# Patient Record
Sex: Female | Born: 1980
Health system: Southern US, Community
[De-identification: ages and names within clinical notes are randomized; demographics above are authoritative.]

## PROBLEM LIST (undated history)

## (undated) DIAGNOSIS — T783XXA Angioneurotic edema, initial encounter: Secondary | ICD-10-CM

## (undated) DIAGNOSIS — L509 Urticaria, unspecified: Secondary | ICD-10-CM

## (undated) DIAGNOSIS — C539 Malignant neoplasm of cervix uteri, unspecified: Secondary | ICD-10-CM

## (undated) HISTORY — PX: LEEP: SHX91

## (undated) HISTORY — PX: OTHER SURGICAL HISTORY: SHX169

## (undated) HISTORY — DX: Urticaria, unspecified: L50.9

## (undated) HISTORY — DX: Angioneurotic edema, initial encounter: T78.3XXA

---

## 2005-02-17 ENCOUNTER — Inpatient Hospital Stay (HOSPITAL_COMMUNITY): Admission: AD | Admit: 2005-02-17 | Discharge: 2005-02-17 | Payer: Self-pay | Admitting: Obstetrics & Gynecology

## 2016-03-28 ENCOUNTER — Emergency Department (HOSPITAL_BASED_OUTPATIENT_CLINIC_OR_DEPARTMENT_OTHER)
Admission: EM | Admit: 2016-03-28 | Discharge: 2016-03-28 | Disposition: A | Discharge status: Home / Self Care | Payer: Medicaid Other | Attending: Physician Assistant | Admitting: Physician Assistant

## 2016-03-28 ENCOUNTER — Encounter (HOSPITAL_BASED_OUTPATIENT_CLINIC_OR_DEPARTMENT_OTHER): Payer: Self-pay

## 2016-03-28 DIAGNOSIS — Z8541 Personal history of malignant neoplasm of cervix uteri: Secondary | ICD-10-CM | POA: Insufficient documentation

## 2016-03-28 DIAGNOSIS — M545 Low back pain: Secondary | ICD-10-CM | POA: Diagnosis present

## 2016-03-28 DIAGNOSIS — M549 Dorsalgia, unspecified: Secondary | ICD-10-CM

## 2016-03-28 HISTORY — DX: Malignant neoplasm of cervix uteri, unspecified: C53.9

## 2016-03-28 LAB — URINALYSIS, ROUTINE W REFLEX MICROSCOPIC
BILIRUBIN URINE: NEGATIVE
Glucose, UA: NEGATIVE mg/dL
KETONES UR: NEGATIVE mg/dL
Leukocytes, UA: NEGATIVE
NITRITE: NEGATIVE
PH: 6.5 (ref 5.0–8.0)
PROTEIN: NEGATIVE mg/dL
Specific Gravity, Urine: 1.02 (ref 1.005–1.030)

## 2016-03-28 LAB — URINE MICROSCOPIC-ADD ON

## 2016-03-28 LAB — PREGNANCY, URINE: Preg Test, Ur: NEGATIVE

## 2016-03-28 MED ORDER — OXYCODONE-ACETAMINOPHEN 5-325 MG PO TABS: 1.0000 | ORAL_TABLET | Freq: Four times a day (QID) | ORAL | Status: DC | PRN

## 2016-03-28 MED ORDER — CYCLOBENZAPRINE HCL 10 MG PO TABS: 10.0000 mg | ORAL_TABLET | Freq: Two times a day (BID) | ORAL | Status: DC | PRN

## 2016-03-28 MED ORDER — IBUPROFEN 800 MG PO TABS: 800.0000 mg | ORAL_TABLET | Freq: Three times a day (TID) | ORAL | Status: DC

## 2016-03-28 MED ORDER — IBUPROFEN 800 MG PO TABS
800.0000 mg | ORAL_TABLET | Freq: Once | ORAL | Status: AC
  Administered 2016-03-28: 800 mg via ORAL
  Filled 2016-03-28: qty 1

## 2016-03-28 MED FILL — IBUPROFEN 800 MG TABLET: 800 | 7 days supply | Qty: 21 | Fill #0

## 2016-03-28 MED FILL — CYCLOBENZAPRINE 10 MG TAB: 10 | 10 days supply | Qty: 10 | Fill #0

## 2016-03-28 MED FILL — OXYCODONE/APAP 5-325: 5-325 | 2 days supply | Qty: 5 | Fill #0

## 2016-03-28 NOTE — Discharge Instructions (Signed)
Back Exercises °The following exercises strengthen the muscles that help to support the back. They also help to keep the lower back flexible. Doing these exercises can help to prevent back pain or lessen existing pain. °If you have back pain or discomfort, try doing these exercises 2-3 times each day or as told by your health care provider. When the pain goes away, do them once each day, but increase the number of times that you repeat the steps for each exercise (do more repetitions). If you do not have back pain or discomfort, do these exercises once each day or as told by your health care provider. °EXERCISES °Single Knee to Chest °Repeat these steps 3-5 times for each leg: °1. Lie on your back on a firm bed or the floor with your legs extended. °2. Bring one knee to your chest. Your other leg should stay extended and in contact with the floor. °3. Hold your knee in place by grabbing your knee or thigh. °4. Pull on your knee until you feel a gentle stretch in your lower back. °5. Hold the stretch for 10-30 seconds. °6. Slowly release and straighten your leg. °Pelvic Tilt °Repeat these steps 5-10 times: °1. Lie on your back on a firm bed or the floor with your legs extended. °2. Bend your knees so they are pointing toward the ceiling and your feet are flat on the floor. °3. Tighten your lower abdominal muscles to press your lower back against the floor. This motion will tilt your pelvis so your tailbone points up toward the ceiling instead of pointing to your feet or the floor. °4. With gentle tension and even breathing, hold this position for 5-10 seconds. °Cat-Cow °Repeat these steps until your lower back becomes more flexible: °1. Get into a hands-and-knees position on a firm surface. Keep your hands under your shoulders, and keep your knees under your hips. You may place padding under your knees for comfort. °2. Let your head hang down, and point your tailbone toward the floor so your lower back becomes  rounded like the back of a cat. °3. Hold this position for 5 seconds. °4. Slowly lift your head and point your tailbone up toward the ceiling so your back forms a sagging arch like the back of a cow. °5. Hold this position for 5 seconds. °Press-Ups °Repeat these steps 5-10 times: °1. Lie on your abdomen (face-down) on the floor. °2. Place your palms near your head, about shoulder-width apart. °3. While you keep your back as relaxed as possible and keep your hips on the floor, slowly straighten your arms to raise the top half of your body and lift your shoulders. Do not use your back muscles to raise your upper torso. You may adjust the placement of your hands to make yourself more comfortable. °4. Hold this position for 5 seconds while you keep your back relaxed. °5. Slowly return to lying flat on the floor. °Bridges °Repeat these steps 10 times: °1. Lie on your back on a firm surface. °2. Bend your knees so they are pointing toward the ceiling and your feet are flat on the floor. °3. Tighten your buttocks muscles and lift your buttocks off of the floor until your waist is at almost the same height as your knees. You should feel the muscles working in your buttocks and the back of your thighs. If you do not feel these muscles, slide your feet 1-2 inches farther away from your buttocks. °4. Hold this position for 3-5   seconds. 5. Slowly lower your hips to the starting position, and allow your buttocks muscles to relax completely. If this exercise is too easy, try doing it with your arms crossed over your chest. Abdominal Crunches Repeat these steps 5-10 times: 1. Lie on your back on a firm bed or the floor with your legs extended. 2. Bend your knees so they are pointing toward the ceiling and your feet are flat on the floor. 3. Cross your arms over your chest. 4. Tip your chin slightly toward your chest without bending your neck. 5. Tighten your abdominal muscles and slowly raise your trunk (torso) high  enough to lift your shoulder blades a tiny bit off of the floor. Avoid raising your torso higher than that, because it can put too much stress on your low back and it does not help to strengthen your abdominal muscles. 6. Slowly return to your starting position. Back Lifts Repeat these steps 5-10 times: 1. Lie on your abdomen (face-down) with your arms at your sides, and rest your forehead on the floor. 2. Tighten the muscles in your legs and your buttocks. 3. Slowly lift your chest off of the floor while you keep your hips pressed to the floor. Keep the back of your head in line with the curve in your back. Your eyes should be looking at the floor. 4. Hold this position for 3-5 seconds. 5. Slowly return to your starting position. SEEK MEDICAL CARE IF:  Your back pain or discomfort gets much worse when you do an exercise.  Your back pain or discomfort does not lessen within 2 hours after you exercise. If you have any of these problems, stop doing these exercises right away. Do not do them again unless your health care provider says that you can. SEEK IMMEDIATE MEDICAL CARE IF:  You develop sudden, severe back pain. If this happens, stop doing the exercises right away. Do not do them again unless your health care provider says that you can.   This information is not intended to replace advice given to you by your health care provider. Make sure you discuss any questions you have with your health care provider.   Document Released: 10/10/2004 Document Revised: 05/24/2015 Document Reviewed: 10/27/2014 Elsevier Interactive Patient Education 2016 [place].  Back Exercises If you have pain in your back, do these exercises 2-3 times each day or as told by your doctor. When the pain goes away, do the exercises once each day, but repeat the steps more times for each exercise (do more repetitions). If you do not have pain in your back, do these exercises once each day or as told by your  doctor. EXERCISES Single Knee to Chest Do these steps 3-5 times in a row for each leg: 7. Lie on your back on a firm bed or the floor with your legs stretched out. 8. Bring one knee to your chest. 9. Hold your knee to your chest by grabbing your knee or thigh. 10. Pull on your knee until you feel a gentle stretch in your lower back. 11. Keep doing the stretch for 10-30 seconds. 12. Slowly let go of your leg and straighten it. Pelvic Tilt Do these steps 5-10 times in a row: 5. Lie on your back on a firm bed or the floor with your legs stretched out. 6. Bend your knees so they point up to the ceiling. Your feet should be flat on the floor. 7. Tighten your lower belly (abdomen) muscles to press your  lower back against the floor. This will make your tailbone point up to the ceiling instead of pointing down to your feet or the floor. 8. Stay in this position for 5-10 seconds while you gently tighten your muscles and breathe evenly. Cat-Cow Do these steps until your lower back bends more easily: 6. Get on your hands and knees on a firm surface. Keep your hands under your shoulders, and keep your knees under your hips. You may put padding under your knees. 7. Let your head hang down, and make your tailbone point down to the floor so your lower back is round like the back of a cat. 8. Stay in this position for 5 seconds. 9. Slowly lift your head and make your tailbone point up to the ceiling so your back hangs low (sags) like the back of a cow. 10. Stay in this position for 5 seconds. Press-Ups Do these steps 5-10 times in a row: 6. Lie on your belly (face-down) on the floor. 7. Place your hands near your head, about shoulder-width apart. 8. While you keep your back relaxed and keep your hips on the floor, slowly straighten your arms to raise the top half of your body and lift your shoulders. Do not use your back muscles. To make yourself more comfortable, you may change where you place your  hands. 9. Stay in this position for 5 seconds. 10. Slowly return to lying flat on the floor. Bridges Do these steps 10 times in a row: 6. Lie on your back on a firm surface. 7. Bend your knees so they point up to the ceiling. Your feet should be flat on the floor. 8. Tighten your butt muscles and lift your butt off of the floor until your waist is almost as high as your knees. If you do not feel the muscles working in your butt and the back of your thighs, slide your feet 1-2 inches farther away from your butt. 9. Stay in this position for 3-5 seconds. 10. Slowly lower your butt to the floor, and let your butt muscles relax. If this exercise is too easy, try doing it with your arms crossed over your chest. Belly Crunches Do these steps 5-10 times in a row: 7. Lie on your back on a firm bed or the floor with your legs stretched out. 8. Bend your knees so they point up to the ceiling. Your feet should be flat on the floor. 9. Cross your arms over your chest. 10. Tip your chin a little bit toward your chest but do not bend your neck. 75. Tighten your belly muscles and slowly raise your chest just enough to lift your shoulder blades a tiny bit off of the floor. 12. Slowly lower your chest and your head to the floor. Back Lifts Do these steps 5-10 times in a row: 6. Lie on your belly (face-down) with your arms at your sides, and rest your forehead on the floor. 7. Tighten the muscles in your legs and your butt. 8. Slowly lift your chest off of the floor while you keep your hips on the floor. Keep the back of your head in line with the curve in your back. Look at the floor while you do this. 9. Stay in this position for 3-5 seconds. 10. Slowly lower your chest and your face to the floor. GET HELP IF:  Your back pain gets a lot worse when you do an exercise.  Your back pain does not lessen 2 hours after  you exercise. If you have any of these problems, stop doing the exercises. Do not do them  again unless your doctor says it is okay. GET HELP RIGHT AWAY IF:  You have sudden, very bad back pain. If this happens, stop doing the exercises. Do not do them again unless your doctor says it is okay.   This information is not intended to replace advice given to you by your health care provider. Make sure you discuss any questions you have with your health care provider.   Document Released: 10/05/2010 Document Revised: 05/24/2015 Document Reviewed: 10/27/2014 Elsevier Interactive Patient Education [employer].

## 2016-03-28 NOTE — ED Provider Notes (Signed)
CSN: [id number]     Arrival date & time 03/28/16  [id number] History   First MD Initiated Contact with Patient 03/28/16 [phone]     Chief Complaint  Patient presents with  . Back Pain     (Consider location/radiation/quality/duration/timing/severity/associated sxs/prior Treatment) HPI  Patient is a 35 year old female with no significant past medical history presenting with lower back pain. She reports worse with moving right left, leaning forward or backwards. She has had no trauma. She has no fever, weakness, numbness, tingling. She reports no urinary symptoms. No pain with urination.  Past Medical History  Diagnosis Date  . Cervical cancer [facility])    Past Surgical History  Procedure Laterality Date  . Breast cyst removal     No family history on file. Social History  Substance Use Topics  . Smoking status: Never Smoker   . Smokeless tobacco: None  . Alcohol Use: No   OB History    No data available     Review of Systems  Constitutional: Negative for fever, activity change and fatigue.  Respiratory: Negative for shortness of breath.   Cardiovascular: Negative for chest pain.  Gastrointestinal: Negative for abdominal pain.  Genitourinary: Negative for urgency and difficulty urinating.  Musculoskeletal: Positive for back pain.  Psychiatric/Behavioral: Negative for confusion and agitation.      Allergies  Chocolate and Sulfa antibiotics  Home Medications   Prior to Admission medications   Not on File   BP 112/78 mmHg  Pulse 75  Temp(Src) 98.6 F (37 C) (Oral)  Resp 16  Ht 5\' 4"  (1.626 m)  Wt 200 lb (90.719 kg)  BMI 34.31 kg/m2  SpO2 100%  LMP 03/06/2016 Physical Exam  Constitutional: She is oriented to person, place, and time. She appears well-developed and well-nourished.  HENT:  Head: Normocephalic and atraumatic.  Eyes: Right eye exhibits no discharge.  Cardiovascular: Normal rate.   No murmur heard. Pulmonary/Chest: Effort normal and breath sounds normal.  No respiratory distress.  Musculoskeletal:  Tenderness all the way across her back. Worse with movement.  Neurological: She is oriented to person, place, and time.  Skin: Skin is warm and dry. She is not diaphoretic.  Psychiatric: She has a normal mood and affect.  Nursing note and vitals reviewed.   ED Course  Procedures (including critical care time) Labs Review Labs Reviewed  URINALYSIS, ROUTINE W REFLEX MICROSCOPIC (NOT AT [facility])  PREGNANCY, URINE    Imaging Review No results found. I have personally reviewed and evaluated these images and lab results as part of my medical decision-making.   EKG Interpretation None      MDM   Final diagnoses:  None   She is a pleasant 35 year old female presented back pain. It is worse with moving. It sounds muscular skeletal in nature. We'll get urine and pregnancy to make sure that it is not kidney infection or other etiology.  Will treat with ibuprofen and flexural. We'll give a couple stronger pain pills for evening pain.  Mykai Wendorf Julio Alm, MD 03/28/16 [phone]

## 2016-03-28 NOTE — ED Notes (Signed)
Pt reports lower lumbar pain, worse with movement from side to side.  Denies any injury or heavy lifting.  Reports worse when she lays down.

## 2016-03-28 NOTE — ED Notes (Signed)
MD at bedside. 

## 2017-08-09 ENCOUNTER — Emergency Department (HOSPITAL_BASED_OUTPATIENT_CLINIC_OR_DEPARTMENT_OTHER)
Admission: EM | Admit: 2017-08-09 | Discharge: 2017-08-10 | Disposition: A | Discharge status: Home / Self Care | Payer: Medicaid Other | Attending: Emergency Medicine | Admitting: Emergency Medicine

## 2017-08-09 ENCOUNTER — Encounter (HOSPITAL_BASED_OUTPATIENT_CLINIC_OR_DEPARTMENT_OTHER): Payer: Self-pay | Admitting: *Deleted

## 2017-08-09 ENCOUNTER — Other Ambulatory Visit: Payer: Self-pay

## 2017-08-09 DIAGNOSIS — Z79899 Other long term (current) drug therapy: Secondary | ICD-10-CM | POA: Diagnosis not present

## 2017-08-09 DIAGNOSIS — J029 Acute pharyngitis, unspecified: Secondary | ICD-10-CM | POA: Diagnosis present

## 2017-08-09 DIAGNOSIS — Z8541 Personal history of malignant neoplasm of cervix uteri: Secondary | ICD-10-CM | POA: Diagnosis not present

## 2017-08-09 LAB — RAPID STREP SCREEN (MED CTR MEBANE ONLY): STREPTOCOCCUS, GROUP A SCREEN (DIRECT): NEGATIVE

## 2017-08-09 MED ORDER — DEXAMETHASONE SODIUM PHOSPHATE 10 MG/ML IJ SOLN
10.0000 mg | Freq: Once | INTRAMUSCULAR | Status: AC
Start: 2017-08-09 — End: 2017-08-09
  Administered 2017-08-09: 10 mg
  Filled 2017-08-09: qty 1

## 2017-08-09 MED ORDER — LIDOCAINE VISCOUS 2 % MT SOLN: 20.0000 mL | OROMUCOSAL | 0 refills | Status: DC | PRN

## 2017-08-09 MED ORDER — IBUPROFEN 400 MG PO TABS
600.0000 mg | ORAL_TABLET | Freq: Once | ORAL | Status: AC
  Administered 2017-08-09: 600 mg via ORAL
  Filled 2017-08-09: qty 1

## 2017-08-09 NOTE — ED Triage Notes (Signed)
Pt reports sore throat since yesterday.  

## 2017-08-09 NOTE — Discharge Instructions (Signed)
Your strep test was negative.  This is likely a viral process.  Do not feel antibiotics are indicated at this time.  Would encourage you to take Motrin and Tylenol.  We will also give the lidocaine rinse to use to numb the throat.  Follow-up with primary care doctor next week if symptoms not improving.  Return the ED if your symptoms worsen.

## 2017-08-11 NOTE — ED Provider Notes (Signed)
[facility] EMERGENCY DEPARTMENT Provider Note   CSN: [id number] Arrival date & time: 08/09/17  2134     History   Chief Complaint Chief Complaint  Patient presents with  . Sore Throat    HPI Cynthia Reese is a 36 y.o. female.  HPI 36 year old no pertinent past medical history presents to the ED for evaluation of sore throat.  He states his sore throat started yesterday.  States that is painful to swallow.  Reports associated chills but denies any known fever.  Patient reports associated rhinorrhea.  Denies any associated productive cough or otalgia.  Patient has tried several over-the-counter medications for her symptoms with little relief.  Nothing makes better or worse. Past Medical History:  Diagnosis Date  . Cervical cancer ([place])     There are no active problems to display for this patient.   Past Surgical History:  Procedure Laterality Date  . breast cyst removal    . LEEP      OB History    No data available       Home Medications    Prior to Admission medications   Medication Sig Start Date End Date Taking? Authorizing Provider  MIRTAZAPINE PO Take by mouth.   Yes [provider]  QUEtiapine Fumarate (SEROQUEL PO) Take by mouth.   Yes [provider]  cyclobenzaprine (FLEXERIL) 10 MG tablet Take 1 tablet (10 mg total) by mouth 2 (two) times daily as needed for muscle spasms. 03/28/16   Mackuen, Courteney Lyn, MD  ibuprofen (ADVIL,MOTRIN) 800 MG tablet Take 1 tablet (800 mg total) by mouth 3 (three) times daily. 03/28/16   Mackuen, Courteney Lyn, MD  lidocaine (XYLOCAINE) 2 % solution Use as directed 20 mLs in the mouth or throat as needed for mouth pain. 08/09/17   Doristine Devoid, PA-C  oxyCODONE-acetaminophen (PERCOCET/ROXICET) 5-325 MG tablet Take 1 tablet by mouth every 6 (six) hours as needed for severe pain. 03/28/16   Mackuen, Fredia Sorrow, MD    Family History No family history on file.  Social History Social  History   Tobacco Use  . Smoking status: Never Smoker  . Smokeless tobacco: Never Used  Substance Use Topics  . Alcohol use: No  . Drug use: No     Allergies   Chocolate and Sulfa antibiotics   Review of Systems Review of Systems  Constitutional: Positive for chills. Negative for fever.  HENT: Positive for congestion, rhinorrhea, sore throat and trouble swallowing.   Respiratory: Negative for cough and shortness of breath.   Musculoskeletal: Negative for myalgias.  Skin: Negative for rash.     Physical Exam Updated Vital Signs BP 130/87 (BP Location: Right Arm)   Pulse 81   Temp 98.3 F (36.8 C) (Oral)   Resp 17   Ht 5\' 4"  (1.626 m)   Wt 100.2 kg (221 lb)   SpO2 100%   BMI 37.93 kg/m   Physical Exam  Constitutional: She appears well-developed and well-nourished. No distress.  HENT:  Head: Normocephalic and atraumatic.  Mouth/Throat: Uvula is midline and mucous membranes are normal. No uvula swelling. Posterior oropharyngeal edema and posterior oropharyngeal erythema present. No oropharyngeal exudate or tonsillar abscesses. Tonsils are 2+ on the right. Tonsils are 2+ on the left. No tonsillar exudate.  Eyes: Right eye exhibits no discharge. Left eye exhibits no discharge. No scleral icterus.  Neck: Normal range of motion. Neck supple.  No c spine midline tenderness. No paraspinal tenderness. No deformities or step offs noted. Full  ROM. Supple. No nuchal rigidity.    Pulmonary/Chest: Effort normal and breath sounds normal. No stridor. No respiratory distress. She has no wheezes. She has no rhonchi. She has no rales. She exhibits no tenderness.  Musculoskeletal: Normal range of motion.  Neurological: She is alert.  Skin: Skin is warm and dry. Capillary refill takes less than 2 seconds. No pallor.  Psychiatric: Her behavior is normal. Judgment and thought content normal.  Nursing note and vitals reviewed.    ED Treatments / Results  Labs (all labs ordered are  listed, but only abnormal results are displayed) Labs Reviewed  RAPID STREP SCREEN (NOT AT [facility])  CULTURE, GROUP A STREP [facility])    EKG  EKG Interpretation None       Radiology No results found.  Procedures Procedures (including critical care time)  Medications Ordered in ED Medications  dexamethasone (DECADRON) injection 10 mg (10 mg Other Given 08/09/17 2352)  ibuprofen (ADVIL,MOTRIN) tablet 600 mg (600 mg Oral Given 08/09/17 2352)     Initial Impression / Assessment and Plan / ED Course  I have reviewed the triage vital signs and the nursing notes.  Pertinent labs & imaging results that were available during my care of the patient were reviewed by me and considered in my medical decision making (see chart for details).     Patient presents to the ED with complaints of sore throat.  Patient is otherwise well-appearing and nontoxic.  Vital signs are reassuring.  Lungs clear to auscultation bilaterally do not feel that imaging is indicated at this time.  Strep test was negative.  Patient has no signs of peritonsillar abscess or deep neck infection.  Patient tolerating p.o. fluids managing his secretions and tolerating her airway appropriately.  Patient given ibuprofen and steroids in the ED.  Encourage symptomatic treatment at home.  Pt is hemodynamically stable, in NAD, & able to ambulate in the ED. Evaluation does not show pathology that would require ongoing emergent intervention or inpatient treatment. I explained the diagnosis to the patient. Pain has been managed & has no complaints prior to dc. Pt is comfortable with above plan and is stable for discharge at this time. All questions were answered prior to disposition. Strict return precautions for f/u to the ED were discussed. Encouraged follow up with PCP.   Final Clinical Impressions(s) / ED Diagnoses   Final diagnoses:  Sore throat    ED Discharge Orders        Ordered    lidocaine (XYLOCAINE) 2 % solution  As  needed     08/09/17 2333       Aaron Edelman 08/11/17 0220    Palumbo, April, MD 08/11/17 0225

## 2017-08-12 LAB — CULTURE, GROUP A STREP (THRC)

## 2017-11-12 ENCOUNTER — Emergency Department (HOSPITAL_BASED_OUTPATIENT_CLINIC_OR_DEPARTMENT_OTHER): Payer: Medicaid Other

## 2017-11-12 ENCOUNTER — Emergency Department (HOSPITAL_BASED_OUTPATIENT_CLINIC_OR_DEPARTMENT_OTHER)
Admission: EM | Admit: 2017-11-12 | Discharge: 2017-11-12 | Disposition: A | Discharge status: Home / Self Care | Payer: Medicaid Other | Attending: Emergency Medicine | Admitting: Emergency Medicine

## 2017-11-12 ENCOUNTER — Other Ambulatory Visit: Payer: Self-pay

## 2017-11-12 ENCOUNTER — Encounter (HOSPITAL_BASED_OUTPATIENT_CLINIC_OR_DEPARTMENT_OTHER): Payer: Self-pay | Admitting: Emergency Medicine

## 2017-11-12 DIAGNOSIS — Z8541 Personal history of malignant neoplasm of cervix uteri: Secondary | ICD-10-CM | POA: Diagnosis not present

## 2017-11-12 DIAGNOSIS — J40 Bronchitis, not specified as acute or chronic: Secondary | ICD-10-CM | POA: Insufficient documentation

## 2017-11-12 DIAGNOSIS — Z79899 Other long term (current) drug therapy: Secondary | ICD-10-CM | POA: Diagnosis not present

## 2017-11-12 DIAGNOSIS — R05 Cough: Secondary | ICD-10-CM | POA: Diagnosis present

## 2017-11-12 MED ORDER — ALBUTEROL SULFATE (2.5 MG/3ML) 0.083% IN NEBU
5.0000 mg | INHALATION_SOLUTION | Freq: Once | RESPIRATORY_TRACT | Status: AC
  Administered 2017-11-12: 5 mg via RESPIRATORY_TRACT
  Filled 2017-11-12: qty 6

## 2017-11-12 MED ORDER — BENZONATATE 100 MG PO CAPS: 100.0000 mg | ORAL_CAPSULE | Freq: Three times a day (TID) | ORAL | 0 refills | Status: DC

## 2017-11-12 MED ORDER — PREDNISONE 20 MG PO TABS: 40.0000 mg | ORAL_TABLET | Freq: Every day | ORAL | 0 refills | Status: DC

## 2017-11-12 MED ORDER — BENZONATATE 100 MG PO CAPS
200.0000 mg | ORAL_CAPSULE | Freq: Once | ORAL | Status: AC
  Administered 2017-11-12: 200 mg via ORAL
  Filled 2017-11-12: qty 2

## 2017-11-12 MED ORDER — ALBUTEROL SULFATE HFA 108 (90 BASE) MCG/ACT IN AERS
2.0000 | INHALATION_SPRAY | Freq: Once | RESPIRATORY_TRACT | Status: AC
  Administered 2017-11-12: 2 via RESPIRATORY_TRACT
  Filled 2017-11-12: qty 6.7

## 2017-11-12 MED FILL — BENZONATATE 100 MG CAPSULE: 100 | 7 days supply | Qty: 21 | Fill #0

## 2017-11-12 MED FILL — predniSONE 20 MG TABS: 20 | 5 days supply | Qty: 10 | Fill #0

## 2017-11-12 NOTE — ED Triage Notes (Signed)
Patient states that she ahs had a cough and generalized body aches x 2 - 4 days, She also is having chest pain with coughing and SOB - pain into her back. The patient reports that she is nauseated and has vomited as well

## 2017-11-12 NOTE — ED Provider Notes (Signed)
[place] EMERGENCY DEPARTMENT Provider Note   CSN: [id number] Arrival date & time: 11/12/17  5956     History   Chief Complaint Chief Complaint  Patient presents with  . Cough    HPI Cynthia Reese is a 37 y.o. female.  HPI Cynthia Reese is a 37 y.o. female presents to emergency department complaining of cough, congestion, body aches for 3 days.  Patient denies measuring her temperature, admits to sweats this morning and last night.  She states that she has been taking Mucinex, cough drops for her symptoms with no relief.  She states that her cough has become more severe today and she reports pain in her chest radiating around that is worse with coughing.  Cough is nonproductive.  She has has not had her influenza shot this year. She reports 1 episode of emesis when her symptoms began, but none since then. Reports associated SOB. States nothing making her symptoms better or worse. No sick contacts. States hard to breath and speak due to cough.   Past Medical History:  Diagnosis Date  . Cervical cancer ([place])     There are no active problems to display for this patient.   Past Surgical History:  Procedure Laterality Date  . breast cyst removal    . LEEP      OB History    No data available       Home Medications    Prior to Admission medications   Medication Sig Start Date End Date Taking? Authorizing Provider  cyclobenzaprine (FLEXERIL) 10 MG tablet Take 1 tablet (10 mg total) by mouth 2 (two) times daily as needed for muscle spasms. 03/28/16   Mackuen, Courteney Lyn, MD  ibuprofen (ADVIL,MOTRIN) 800 MG tablet Take 1 tablet (800 mg total) by mouth 3 (three) times daily. 03/28/16   Mackuen, Courteney Lyn, MD  lidocaine (XYLOCAINE) 2 % solution Use as directed 20 mLs in the mouth or throat as needed for mouth pain. 08/09/17   Ocie Cornfield T, PA-C  MIRTAZAPINE PO Take by mouth.    [provider]  oxyCODONE-acetaminophen (PERCOCET/ROXICET)  5-325 MG tablet Take 1 tablet by mouth every 6 (six) hours as needed for severe pain. 03/28/16   Mackuen, Courteney Lyn, MD  QUEtiapine Fumarate (SEROQUEL PO) Take by mouth.    [provider]    Family History History reviewed. No pertinent family history.  Social History Social History   Tobacco Use  . Smoking status: Never Smoker  . Smokeless tobacco: Never Used  Substance Use Topics  . Alcohol use: No  . Drug use: No     Allergies   Chocolate and Sulfa antibiotics   Review of Systems Review of Systems  Constitutional: Positive for chills and fever.  HENT: Positive for congestion. Negative for sore throat, trouble swallowing and voice change.   Respiratory: Positive for cough, chest tightness and shortness of breath.   Cardiovascular: Positive for chest pain. Negative for palpitations and leg swelling.  Gastrointestinal: Positive for vomiting. Negative for abdominal pain, diarrhea and nausea.  Genitourinary: Negative for dysuria, flank pain, pelvic pain, vaginal bleeding, vaginal discharge and vaginal pain.  Musculoskeletal: Negative for arthralgias, myalgias, neck pain and neck stiffness.  Skin: Negative for rash.  Neurological: Positive for headaches. Negative for dizziness and weakness.  All other systems reviewed and are negative.    Physical Exam Updated Vital Signs BP (!) 143/88 (BP Location: Right Arm)   Pulse (!) 110   Temp 98.3 F (36.8 C) (Oral)  Resp 20   Ht 5\' 4"  (1.626 m)   Wt 100.7 kg (222 lb)   SpO2 100%   BMI 38.11 kg/m   Physical Exam  Constitutional: She appears well-developed and well-nourished. No distress.  HENT:  Head: Normocephalic.  Right Ear: External ear normal.  Left Ear: External ear normal.  Nose: Rhinorrhea present.  Mouth/Throat: Uvula is midline and mucous membranes are normal. No oropharyngeal exudate or posterior oropharyngeal erythema.  Eyes: Conjunctivae are normal.  Neck: Neck supple.  Cardiovascular: Normal  rate, regular rhythm and normal heart sounds.  Pulmonary/Chest: Effort normal. No respiratory distress. She has wheezes. She has no rales.  Inspiratory and expiratory wheezes and coughing  Musculoskeletal: She exhibits no edema.  Neurological: She is alert.  Skin: Skin is warm and dry.  Psychiatric: She has a normal mood and affect. Her behavior is normal.  Nursing note and vitals reviewed.    ED Treatments / Results  Labs (all labs ordered are listed, but only abnormal results are displayed) Labs Reviewed - No data to display  EKG  EKG Interpretation  Date/Time:  Wednesday November 12 2017 10:17:15 EST Ventricular Rate:  103 PR Interval:    QRS Duration: 84 QT Interval:  309 QTC Calculation: 405 R Axis:   42 Text Interpretation:  Sinus tachycardia Nonspecific repol abnormality, inferior leads Borderline ST elevation, lateral leads No old tracing to compare Early repolarization Confirmed by Orlie Dakin [phone]) on 11/12/2017 10:21:02 AM Also confirmed by Orlie Dakin [phone])  on 11/12/2017 10:24:35 AM       Radiology Dg Chest 2 View  Result Date: 11/12/2017 CLINICAL DATA:  Cough, fever, body aches, and vomiting for the past 3 days. EXAM: CHEST  2 VIEW COMPARISON:  Chest x-ray of October 09, 2016 FINDINGS: The lungs are adequately inflated and clear. The heart and pulmonary vascularity are normal. The mediastinum is normal in width. The bony thorax is unremarkable. The observed portions of the upper abdomen are normal. IMPRESSION: There is no pneumonia nor other acute cardiopulmonary abnormality. Electronically Signed   By: David  Martinique M.D.   On: 11/12/2017 10:46    Procedures Procedures (including critical care time)  Medications Ordered in ED Medications  albuterol (PROVENTIL HFA;VENTOLIN HFA) 108 (90 Base) MCG/ACT inhaler 2 puff (not administered)  benzonatate (TESSALON) capsule 200 mg (not administered)  albuterol (PROVENTIL) (2.5 MG/3ML) 0.083% nebulizer solution  5 mg (5 mg Nebulization Given 11/12/17 1037)     Initial Impression / Assessment and Plan / ED Course  I have reviewed the triage vital signs and the nursing notes.  Pertinent labs & imaging results that were available during my care of the patient were reviewed by me and considered in my medical decision making (see chart for details).     Patient in emergency department with flulike symptoms, cough, wheezing.  Patient is tachycardic, otherwise normal vital signs.  She is afebrile.  Initially wheezing on exam, was given a breathing treatment.  Chest x-ray is negative.  Wheezing improved after breathing treatment and patient feels better.  Her chest pain is reproduced with coughing only, and it is nonexertional.  Most likely upper respiratory tract infection, possibly influenza, however at this point she is out of the window for Tamiflu.  We will treat for bronchitis, will start on prednisone, albuterol inhaler, will give prescription for Tessalon Perles for cough.  Advised to take Tylenol or Motrin for her pain and body aches.  Follow-up with family doctor.  Return precautions discussed.   Vitals:  11/12/17 1013 11/12/17 1039 11/12/17 1118 11/12/17 1127  BP:    119/75  Pulse:    97  Resp:    20  Temp:      TempSrc:      SpO2:  100% 100% 100%  Weight: 100.7 kg (222 lb)     Height: 5\' 4"  (1.626 m)        Final Clinical Impressions(s) / ED Diagnoses   Final diagnoses:  Bronchitis    ED Discharge Orders        Ordered    predniSONE (DELTASONE) 20 MG tablet  Daily     11/12/17 1115    benzonatate (TESSALON) 100 MG capsule  Every 8 hours     11/12/17 1115       Jeannett Senior, PA-C 11/12/17 1541    Orlie Dakin, MD 11/12/17 1645

## 2017-11-12 NOTE — Discharge Instructions (Signed)
Use inhaler for cough and wheezing, two puffs every 4 hrs. Take tessalon for cough. Prednisone for inflammation. Take tylenol or motrin for pain. Follow up with family doctor if not improving in 2-3 days. Return if worsening.

## 2018-03-18 ENCOUNTER — Emergency Department (HOSPITAL_BASED_OUTPATIENT_CLINIC_OR_DEPARTMENT_OTHER): Payer: Medicaid Other

## 2018-03-18 ENCOUNTER — Encounter (HOSPITAL_BASED_OUTPATIENT_CLINIC_OR_DEPARTMENT_OTHER): Payer: Self-pay | Admitting: Emergency Medicine

## 2018-03-18 ENCOUNTER — Other Ambulatory Visit: Payer: Self-pay

## 2018-03-18 ENCOUNTER — Emergency Department (HOSPITAL_BASED_OUTPATIENT_CLINIC_OR_DEPARTMENT_OTHER)
Admission: EM | Admit: 2018-03-18 | Discharge: 2018-03-18 | Disposition: A | Discharge status: Home / Self Care | Payer: Medicaid Other | Attending: Emergency Medicine | Admitting: Emergency Medicine

## 2018-03-18 DIAGNOSIS — Y939 Activity, unspecified: Secondary | ICD-10-CM | POA: Insufficient documentation

## 2018-03-18 DIAGNOSIS — S29011A Strain of muscle and tendon of front wall of thorax, initial encounter: Secondary | ICD-10-CM

## 2018-03-18 DIAGNOSIS — S29001A Unspecified injury of muscle and tendon of front wall of thorax, initial encounter: Secondary | ICD-10-CM | POA: Diagnosis present

## 2018-03-18 DIAGNOSIS — X58XXXA Exposure to other specified factors, initial encounter: Secondary | ICD-10-CM | POA: Insufficient documentation

## 2018-03-18 DIAGNOSIS — Z79899 Other long term (current) drug therapy: Secondary | ICD-10-CM | POA: Insufficient documentation

## 2018-03-18 DIAGNOSIS — Z8541 Personal history of malignant neoplasm of cervix uteri: Secondary | ICD-10-CM | POA: Insufficient documentation

## 2018-03-18 DIAGNOSIS — Y929 Unspecified place or not applicable: Secondary | ICD-10-CM | POA: Insufficient documentation

## 2018-03-18 DIAGNOSIS — Y998 Other external cause status: Secondary | ICD-10-CM | POA: Insufficient documentation

## 2018-03-18 DIAGNOSIS — R079 Chest pain, unspecified: Secondary | ICD-10-CM | POA: Diagnosis not present

## 2018-03-18 LAB — CBC WITH DIFFERENTIAL/PLATELET
Basophils Absolute: 0 10*3/uL (ref 0.0–0.1)
Basophils Relative: 0 %
Eosinophils Absolute: 0.1 10*3/uL (ref 0.0–0.7)
Eosinophils Relative: 2 %
HEMATOCRIT: 43 % (ref 36.0–46.0)
Hemoglobin: 14.4 g/dL (ref 12.0–15.0)
LYMPHS PCT: 40 %
Lymphs Abs: 2.7 10*3/uL (ref 0.7–4.0)
MCH: 29.2 pg (ref 26.0–34.0)
MCHC: 33.5 g/dL (ref 30.0–36.0)
MCV: 87.2 fL (ref 78.0–100.0)
MONO ABS: 0.5 10*3/uL (ref 0.1–1.0)
MONOS PCT: 7 %
NEUTROS ABS: 3.5 10*3/uL (ref 1.7–7.7)
Neutrophils Relative %: 51 %
Platelets: 236 10*3/uL (ref 150–400)
RBC: 4.93 MIL/uL (ref 3.87–5.11)
RDW: 13.9 % (ref 11.5–15.5)
WBC: 6.8 10*3/uL (ref 4.0–10.5)

## 2018-03-18 LAB — COMPREHENSIVE METABOLIC PANEL
ALT: 20 U/L (ref 0–44)
ANION GAP: 8 (ref 5–15)
AST: 25 U/L (ref 15–41)
Albumin: 4.1 g/dL (ref 3.5–5.0)
Alkaline Phosphatase: 113 U/L (ref 38–126)
BILIRUBIN TOTAL: 0.4 mg/dL (ref 0.3–1.2)
BUN: 18 mg/dL (ref 6–20)
CO2: 24 mmol/L (ref 22–32)
Calcium: 9.4 mg/dL (ref 8.9–10.3)
Chloride: 104 mmol/L (ref 98–111)
Creatinine, Ser: 1.15 mg/dL — ABNORMAL HIGH (ref 0.44–1.00)
GFR calc Af Amer: >60 mL/min (ref >60)
Glucose, Bld: 85 mg/dL (ref 70–99)
POTASSIUM: 4 mmol/L (ref 3.5–5.1)
Sodium: 136 mmol/L (ref 135–145)
TOTAL PROTEIN: 7.8 g/dL (ref 6.5–8.1)

## 2018-03-18 LAB — TROPONIN I

## 2018-03-18 MED ORDER — METHOCARBAMOL 500 MG PO TABS
1000.0000 mg | ORAL_TABLET | Freq: Once | ORAL | Status: AC
  Administered 2018-03-18: 1000 mg via ORAL
  Filled 2018-03-18: qty 2

## 2018-03-18 MED ORDER — KETOROLAC TROMETHAMINE 30 MG/ML IJ SOLN
30.0000 mg | Freq: Once | INTRAMUSCULAR | Status: AC
Start: 2018-03-18 — End: 2018-03-18
  Administered 2018-03-18: 30 mg via INTRAVENOUS
  Filled 2018-03-18: qty 1

## 2018-03-18 MED ORDER — METHOCARBAMOL 500 MG PO TABS: 1000.0000 mg | ORAL_TABLET | Freq: Four times a day (QID) | ORAL | 0 refills | Status: DC | PRN

## 2018-03-18 MED ORDER — KETOROLAC TROMETHAMINE 60 MG/2ML IM SOLN
60.0000 mg | Freq: Once | INTRAMUSCULAR | Status: DC
  Filled 2018-03-18: qty 2

## 2018-03-18 MED ORDER — IBUPROFEN 600 MG PO TABS: 600.0000 mg | ORAL_TABLET | Freq: Three times a day (TID) | ORAL | 0 refills | Status: DC | PRN

## 2018-03-18 MED ORDER — METHOCARBAMOL 1000 MG/10ML IJ SOLN
1000.0000 mg | Freq: Once | INTRAMUSCULAR | Status: DC
  Filled 2018-03-18: qty 10

## 2018-03-18 NOTE — ED Triage Notes (Addendum)
Reports right sided chest pain which worsens with movement.  Denies shortness of breath, nausea, vomiting.  Denies injury.  Pain worsens on palpation.

## 2018-03-18 NOTE — ED Provider Notes (Signed)
[place] [facility] EMERGENCY DEPARTMENT Provider Note   CSN: [id number] Arrival date & time: 03/18/18  1436     History   Chief Complaint Chief Complaint  Patient presents with  . Chest Pain    HPI Cynthia Reese is a 37 y.o. female.  HPI Patient presents with 1 week of right-sided chest pain.  States the pain is worse with movement and palpation.  Worse with deep breathing and coughing.  No fever or chills.  No known injuries.  No new lower extremity swelling or pain. Past Medical History:  Diagnosis Date  . Cervical cancer ([place])     There are no active problems to display for this patient.   Past Surgical History:  Procedure Laterality Date  . breast cyst removal    . LEEP       OB History   None      Home Medications    Prior to Admission medications   Medication Sig Start Date End Date Taking? Authorizing Provider  benzonatate (TESSALON) 100 MG capsule Take 1 capsule (100 mg total) by mouth every 8 (eight) hours. 11/12/17   Kirichenko, Lahoma Rocker, PA-C  cyclobenzaprine (FLEXERIL) 10 MG tablet Take 1 tablet (10 mg total) by mouth 2 (two) times daily as needed for muscle spasms. 03/28/16   Mackuen, Courteney Lyn, MD  ibuprofen (ADVIL,MOTRIN) 600 MG tablet Take 1 tablet (600 mg total) by mouth 3 (three) times daily with meals as needed. 03/18/18   Julianne Rice, MD  lidocaine (XYLOCAINE) 2 % solution Use as directed 20 mLs in the mouth or throat as needed for mouth pain. 08/09/17   Doristine Devoid, PA-C  methocarbamol (ROBAXIN) 500 MG tablet Take 2 tablets (1,000 mg total) by mouth every 6 (six) hours as needed for muscle spasms. 03/18/18   Julianne Rice, MD  MIRTAZAPINE PO Take by mouth.    [provider]  oxyCODONE-acetaminophen (PERCOCET/ROXICET) 5-325 MG tablet Take 1 tablet by mouth every 6 (six) hours as needed for severe pain. 03/28/16   Mackuen, Courteney Lyn, MD  predniSONE (DELTASONE) 20 MG tablet Take 2 tablets (40 mg total) by mouth  daily. 11/12/17   Kirichenko, Tatyana, PA-C  QUEtiapine Fumarate (SEROQUEL PO) Take by mouth.    [provider]    Family History History reviewed. No pertinent family history.  Social History Social History   Tobacco Use  . Smoking status: Never Smoker  . Smokeless tobacco: Never Used  Substance Use Topics  . Alcohol use: No  . Drug use: No     Allergies   Chocolate and Sulfa antibiotics   Review of Systems Review of Systems  Constitutional: Negative for chills and fever.  Respiratory: Negative for cough and shortness of breath.   Cardiovascular: Positive for chest pain. Negative for palpitations and leg swelling.  Gastrointestinal: Negative for abdominal pain, constipation, diarrhea, nausea and vomiting.  Genitourinary: Negative for dysuria and frequency.  Musculoskeletal: Positive for myalgias. Negative for back pain, neck pain and neck stiffness.  Skin: Negative for rash and wound.  Neurological: Negative for dizziness, weakness, light-headedness, numbness and headaches.  All other systems reviewed and are negative.    Physical Exam Updated Vital Signs BP 134/90   Pulse 79   Temp 98.3 F (36.8 C) (Oral)   Resp 18   Ht 5\' 4"  (1.626 m)   Wt 106.1 kg (234 lb)   SpO2 100%   BMI 40.17 kg/m   Physical Exam  Constitutional: She is oriented to person, place, and  time. She appears well-developed and well-nourished. No distress.  HENT:  Head: Normocephalic and atraumatic.  Mouth/Throat: Oropharyngeal exudate present.  Eyes: Pupils are equal, round, and reactive to light. EOM are normal.  Neck: Normal range of motion. Neck supple.  Cardiovascular: Normal rate and regular rhythm. Exam reveals no gallop and no friction rub.  No murmur heard. Pulmonary/Chest: Effort normal and breath sounds normal. No stridor. No respiratory distress. She has no wheezes. She has no rales. She exhibits tenderness.  Chest tenderness over the right pectoralis muscle.  There is  no obvious swelling, deformity or warmth.  Exacerbated with movement of the right arm.  Abdominal: Soft. Bowel sounds are normal. There is no tenderness. There is no rebound and no guarding.  Musculoskeletal: Normal range of motion. She exhibits no edema or tenderness.  No lower extremity swelling, asymmetry or tenderness.  No upper extremity swelling.  2+ radial pulses bilaterally.  Patient has mild right trapezius tenderness.  No midline thoracic or lumbar tenderness.  Neurological: She is alert and oriented to person, place, and time.  5/5 motor in all extremities.  Sensation fully intact.  Skin: Skin is warm and dry. Capillary refill takes less than 2 seconds. No rash noted. She is not diaphoretic. No erythema.  Psychiatric: She has a normal mood and affect. Her behavior is normal.  Nursing note and vitals reviewed.    ED Treatments / Results  Labs (all labs ordered are listed, but only abnormal results are displayed) Labs Reviewed  COMPREHENSIVE METABOLIC PANEL - Abnormal; Notable for the following components:      Result Value   Creatinine, Ser 1.15 (*)    All other components within normal limits  CBC WITH DIFFERENTIAL/PLATELET  TROPONIN I    EKG EKG Interpretation  Date/Time:  Wednesday March 18 2018 14:52:27 EDT Ventricular Rate:  73 PR Interval:  144 QRS Duration: 82 QT Interval:  350 QTC Calculation: 385 R Axis:   70 Text Interpretation:  Normal sinus rhythm Normal ECG Confirmed by Julianne Rice [phone]) on 03/18/2018 3:46:22 PM   Radiology Dg Chest 2 View  Result Date: 03/18/2018 CLINICAL DATA:  Chest pain and shortness of breath. EXAM: CHEST - 2 VIEW COMPARISON:  11/12/2017 FINDINGS: The heart size and mediastinal contours are within normal limits. Both lungs are clear. The visualized skeletal structures are unremarkable. IMPRESSION: Normal exam. Electronically Signed   By: Lorriane Shire M.D.   On: 03/18/2018 16:41    Procedures Procedures (including critical  care time)  Medications Ordered in ED Medications  methocarbamol (ROBAXIN) tablet 1,000 mg (1,000 mg Oral Given 03/18/18 1635)  ketorolac (TORADOL) 30 MG/ML injection 30 mg (30 mg Intravenous Given 03/18/18 1641)     Initial Impression / Assessment and Plan / ED Course  I have reviewed the triage vital signs and the nursing notes.  Pertinent labs & imaging results that were available during my care of the patient were reviewed by me and considered in my medical decision making (see chart for details).     Pain is reproduced with palpation of the right chest wall.  X-ray without acute findings.  Troponin and EKG are both normal.  Will treat for right chest wall strain.  Return precautions given.  Final Clinical Impressions(s) / ED Diagnoses   Final diagnoses:  Muscle strain of chest wall, initial encounter    ED Discharge Orders        Ordered    ibuprofen (ADVIL,MOTRIN) 600 MG tablet  3 times daily with meals  PRN     03/18/18 1744    methocarbamol (ROBAXIN) 500 MG tablet  Every 6 hours PRN     03/18/18 1744       Julianne Rice, MD 03/18/18 1745

## 2019-02-27 ENCOUNTER — Encounter (HOSPITAL_BASED_OUTPATIENT_CLINIC_OR_DEPARTMENT_OTHER): Payer: Self-pay

## 2019-02-27 ENCOUNTER — Other Ambulatory Visit: Payer: Self-pay

## 2019-02-27 ENCOUNTER — Emergency Department (HOSPITAL_BASED_OUTPATIENT_CLINIC_OR_DEPARTMENT_OTHER): Payer: Medicaid Other

## 2019-02-27 ENCOUNTER — Emergency Department (HOSPITAL_BASED_OUTPATIENT_CLINIC_OR_DEPARTMENT_OTHER)
Admission: EM | Admit: 2019-02-27 | Discharge: 2019-02-27 | Disposition: A | Discharge status: Home / Self Care | Payer: Medicaid Other | Attending: Emergency Medicine | Admitting: Emergency Medicine

## 2019-02-27 DIAGNOSIS — Y92410 Unspecified street and highway as the place of occurrence of the external cause: Secondary | ICD-10-CM | POA: Insufficient documentation

## 2019-02-27 DIAGNOSIS — M79652 Pain in left thigh: Secondary | ICD-10-CM | POA: Diagnosis not present

## 2019-02-27 DIAGNOSIS — Y9389 Activity, other specified: Secondary | ICD-10-CM | POA: Insufficient documentation

## 2019-02-27 DIAGNOSIS — M545 Low back pain: Secondary | ICD-10-CM | POA: Insufficient documentation

## 2019-02-27 DIAGNOSIS — Y999 Unspecified external cause status: Secondary | ICD-10-CM | POA: Diagnosis not present

## 2019-02-27 DIAGNOSIS — Z8541 Personal history of malignant neoplasm of cervix uteri: Secondary | ICD-10-CM | POA: Diagnosis not present

## 2019-02-27 DIAGNOSIS — Z79899 Other long term (current) drug therapy: Secondary | ICD-10-CM | POA: Diagnosis not present

## 2019-02-27 DIAGNOSIS — S199XXA Unspecified injury of neck, initial encounter: Secondary | ICD-10-CM | POA: Diagnosis present

## 2019-02-27 DIAGNOSIS — S161XXA Strain of muscle, fascia and tendon at neck level, initial encounter: Secondary | ICD-10-CM

## 2019-02-27 DIAGNOSIS — M7918 Myalgia, other site: Secondary | ICD-10-CM

## 2019-02-27 MED ORDER — ACETAMINOPHEN 500 MG PO TABS
1000.0000 mg | ORAL_TABLET | Freq: Once | ORAL | Status: AC
  Administered 2019-02-27: 1000 mg via ORAL
  Filled 2019-02-27: qty 2

## 2019-02-27 MED ORDER — METHOCARBAMOL 500 MG PO TABS: 500.0000 mg | ORAL_TABLET | Freq: Two times a day (BID) | ORAL | 0 refills | Status: DC

## 2019-02-27 NOTE — Discharge Instructions (Signed)

## 2019-02-27 NOTE — ED Notes (Signed)
Patient transported to CT 
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
## 2019-03-03 ENCOUNTER — Other Ambulatory Visit: Payer: Self-pay

## 2019-03-03 ENCOUNTER — Emergency Department (HOSPITAL_COMMUNITY): Payer: Medicaid Other

## 2019-03-03 ENCOUNTER — Emergency Department (HOSPITAL_BASED_OUTPATIENT_CLINIC_OR_DEPARTMENT_OTHER): Payer: Medicaid Other

## 2019-03-03 ENCOUNTER — Emergency Department (HOSPITAL_BASED_OUTPATIENT_CLINIC_OR_DEPARTMENT_OTHER)
Admission: EM | Admit: 2019-03-03 | Discharge: 2019-03-04 | Disposition: A | Discharge status: Home / Self Care | Payer: Medicaid Other | Attending: Emergency Medicine | Admitting: Emergency Medicine

## 2019-03-03 ENCOUNTER — Encounter (HOSPITAL_BASED_OUTPATIENT_CLINIC_OR_DEPARTMENT_OTHER): Payer: Self-pay

## 2019-03-03 DIAGNOSIS — M5489 Other dorsalgia: Secondary | ICD-10-CM | POA: Insufficient documentation

## 2019-03-03 DIAGNOSIS — Z79899 Other long term (current) drug therapy: Secondary | ICD-10-CM | POA: Diagnosis not present

## 2019-03-03 DIAGNOSIS — M549 Dorsalgia, unspecified: Secondary | ICD-10-CM

## 2019-03-03 MED ORDER — KETOROLAC TROMETHAMINE 30 MG/ML IJ SOLN
60.0000 mg | Freq: Once | INTRAMUSCULAR | Status: AC
  Administered 2019-03-04: 60 mg via INTRAMUSCULAR
  Filled 2019-03-03: qty 2
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
## 2019-03-04 LAB — PREGNANCY, URINE: Preg Test, Ur: NEGATIVE

## 2019-03-04 MED ORDER — HYDROCODONE-ACETAMINOPHEN 5-325 MG PO TABS: 1.0000 | ORAL_TABLET | Freq: Four times a day (QID) | ORAL | 0 refills | Status: DC | PRN

## 2019-03-04 NOTE — Discharge Instructions (Signed)
Your x-rays today showed no fracture.  Please continue taking ibuprofen 800 mg every 8 hours as needed for pain.  You may continue your muscle relaxers as prescribed.

## 2019-07-07 ENCOUNTER — Other Ambulatory Visit: Payer: Self-pay

## 2019-07-07 ENCOUNTER — Encounter (HOSPITAL_BASED_OUTPATIENT_CLINIC_OR_DEPARTMENT_OTHER): Payer: Self-pay | Admitting: Emergency Medicine

## 2019-07-07 ENCOUNTER — Emergency Department (HOSPITAL_BASED_OUTPATIENT_CLINIC_OR_DEPARTMENT_OTHER)
Admission: EM | Admit: 2019-07-07 | Discharge: 2019-07-07 | Disposition: A | Discharge status: Home / Self Care | Payer: Medicaid Other | Attending: Emergency Medicine | Admitting: Emergency Medicine

## 2019-07-07 DIAGNOSIS — T7840XA Allergy, unspecified, initial encounter: Secondary | ICD-10-CM | POA: Diagnosis not present

## 2019-07-07 DIAGNOSIS — Z79899 Other long term (current) drug therapy: Secondary | ICD-10-CM | POA: Insufficient documentation

## 2019-07-07 DIAGNOSIS — R21 Rash and other nonspecific skin eruption: Secondary | ICD-10-CM | POA: Diagnosis present

## 2019-07-07 MED ORDER — FAMOTIDINE 20 MG PO TABS
20.0000 mg | ORAL_TABLET | Freq: Once | ORAL | Status: AC
  Administered 2019-07-07: 20 mg via ORAL
  Filled 2019-07-07: qty 1

## 2019-07-07 MED ORDER — PREDNISONE 50 MG PO TABS
60.0000 mg | ORAL_TABLET | Freq: Once | ORAL | Status: AC
  Administered 2019-07-07: 60 mg via ORAL
  Filled 2019-07-07: qty 1

## 2019-07-07 MED ORDER — LORATADINE 10 MG PO TABS
10.0000 mg | ORAL_TABLET | Freq: Once | ORAL | Status: AC
  Administered 2019-07-07: 10 mg via ORAL
  Filled 2019-07-07: qty 1

## 2019-07-07 MED ORDER — PREDNISONE 20 MG PO TABS: ORAL_TABLET | ORAL | 0 refills | Status: DC

## 2019-07-07 MED ORDER — FAMOTIDINE 20 MG PO TABS: 20.0000 mg | ORAL_TABLET | Freq: Two times a day (BID) | ORAL | 0 refills | Status: DC

## 2019-07-07 NOTE — ED Provider Notes (Signed)
[facility]ERG[facility] DEPARTMENT Provider Note   CSN: [id number] Arrival date & time: 07/07/19  [id number]     History   Chief Complaint Chief Complaint  Patient presents with   Rash    HPI Cynthia Reese is a 38 y.o. female.     The history is provided by the patient.  Rash Location:  Full body Quality: itchiness   Severity:  Moderate Onset quality:  Gradual Duration:  10 days Timing:  Constant Progression:  Worsening Chronicity:  New Context: medications   Context comment:  Started tramadol Relieved by:  Nothing Worsened by:  Nothing Ineffective treatments:  Antihistamines Associated symptoms: no abdominal pain, no diarrhea, no fatigue, no hoarse voice, no periorbital edema, no shortness of breath, no throat swelling, no tongue swelling and not wheezing   Start tramadol and now having hives daily worst in the evening.  No f/c/r.  No SOB.  No facial or lip or tongue swelling.    Past Medical History:  Diagnosis Date   Cervical cancer ([place])     There are no active problems to display for this patient.   Past Surgical History:  Procedure Laterality Date   breast cyst removal     LEEP       OB History   No obstetric history on file.      Home Medications    Prior to Admission medications   Medication Sig Start Date End Date Taking? Authorizing Provider  benzonatate (TESSALON) 100 MG capsule Take 1 capsule (100 mg total) by mouth every 8 (eight) hours. 11/12/17   Kirichenko, Lahoma Rocker, PA-C  cyclobenzaprine (FLEXERIL) 10 MG tablet Take 1 tablet (10 mg total) by mouth 2 (two) times daily as needed for muscle spasms. 03/28/16   Mackuen, Courteney Lyn, MD  famotidine (PEPCID) 20 MG tablet Take 1 tablet (20 mg total) by mouth 2 (two) times daily. 07/07/19   Debrina Kizer, MD  HYDROcodone-acetaminophen (NORCO/VICODIN) 5-325 MG tablet Take 1 tablet by mouth every 6 (six) hours as needed. 03/04/19   Ward, Delice Bison, DO  ibuprofen (ADVIL,MOTRIN) 600 MG  tablet Take 1 tablet (600 mg total) by mouth 3 (three) times daily with meals as needed. 03/18/18   Julianne Rice, MD  lidocaine (XYLOCAINE) 2 % solution Use as directed 20 mLs in the mouth or throat as needed for mouth pain. 08/09/17   Doristine Devoid, PA-C  methocarbamol (ROBAXIN) 500 MG tablet Take 1 tablet (500 mg total) by mouth 2 (two) times daily. 02/27/19   Providence Lanius A, PA-C  MIRTAZAPINE PO Take by mouth.    [provider]  oxyCODONE-acetaminophen (PERCOCET/ROXICET) 5-325 MG tablet Take 1 tablet by mouth every 6 (six) hours as needed for severe pain. 03/28/16   Mackuen, Courteney Lyn, MD  predniSONE (DELTASONE) 20 MG tablet Take 2 tablets (40 mg total) by mouth daily. 11/12/17   Kirichenko, Lahoma Rocker, PA-C  predniSONE (DELTASONE) 20 MG tablet 3 tabs po day one, then 2 po daily x 4 days 07/07/19   Ajah Vanhoose, MD  QUEtiapine Fumarate (SEROQUEL PO) Take by mouth.    [provider]    Family History No family history on file.  Social History Social History   Tobacco Use   Smoking status: Never Smoker   Smokeless tobacco: Never Used  Substance Use Topics   Alcohol use: No   Drug use: No     Allergies   Chocolate and Sulfa antibiotics   Review of Systems Review of Systems  Constitutional: Negative  for fatigue.  HENT: Negative for congestion, facial swelling and hoarse voice.   Respiratory: Negative for shortness of breath and wheezing.   Gastrointestinal: Negative for abdominal pain and diarrhea.  Genitourinary: Negative for difficulty urinating.  Skin: Positive for rash.  Neurological: Negative for weakness.  Psychiatric/Behavioral: Negative for agitation.  All other systems reviewed and are negative.    Physical Exam Updated Vital Signs BP (!) 131/101    Pulse 99    Temp 98 F (36.7 C) (Oral)    Resp 18    Ht 5\' 4"  (1.626 m)    Wt 103 kg    SpO2 100%    BMI 38.96 kg/m   Physical Exam Vitals signs and nursing note reviewed.    Constitutional:      General: She is not in acute distress. HENT:     Head: Normocephalic and atraumatic.     Nose: Nose normal.     Mouth/Throat:     Comments: No swelling of the lips tongue or uvula Eyes:     Conjunctiva/sclera: Conjunctivae normal.     Pupils: Pupils are equal, round, and reactive to light.  Neck:     Musculoskeletal: Normal range of motion and neck supple.  Cardiovascular:     Rate and Rhythm: Normal rate and regular rhythm.     Pulses: Normal pulses.     Heart sounds: Normal heart sounds.  Pulmonary:     Effort: Pulmonary effort is normal.     Breath sounds: Normal breath sounds.  Abdominal:     General: Abdomen is flat. Bowel sounds are normal.     Tenderness: There is no abdominal tenderness. There is no guarding or rebound.  Musculoskeletal: Normal range of motion.  Skin:    General: Skin is warm and dry.     Capillary Refill: Capillary refill takes less than 2 seconds.     Comments: Urticaria trunk and all 4 extremities  Neurological:     General: No focal deficit present.     Mental Status: She is alert and oriented to person, place, and time.     Deep Tendon Reflexes: Reflexes normal.  Psychiatric:        Mood and Affect: Mood normal.        Behavior: Behavior normal.      ED Treatments / Results  Labs (all labs ordered are listed, but only abnormal results are displayed) Labs Reviewed - No data to display  EKG None  Radiology No results found.  Procedures Procedures (including critical care time)  Medications Ordered in ED Medications  predniSONE (DELTASONE) tablet 60 mg (has no administration in time range)  famotidine (PEPCID) tablet 20 mg (has no administration in time range)  loratadine (CLARITIN) tablet 10 mg (has no administration in time range)     Stop tramadol as this is the likely cause.  Will start a steroids taper, add pepcid twice daily and benadryl.  If symptoms persist follow up with allergist.  Strict return  precautions for worsening of symptoms  [facility] was evaluated in Emergency Department on 07/07/2019 for the symptoms described in the history of present illness. She was evaluated in the context of the global COVID-19 pandemic, which necessitated consideration that the patient might be at risk for infection with the SARS-CoV-2 virus that causes COVID-19. Institutional protocols and algorithms that pertain to the evaluation of patients at risk for COVID-19 are in a state of rapid change based on information released by regulatory bodies including the CDC  and federal and state organizations. These policies and algorithms were followed during the patient's care in the ED.   Final Clinical Impressions(s) / ED Diagnoses   Return for weakness, numbness, changes in vision or speech, fevers >100.4 unrelieved by medication, shortness of breath, intractable vomiting, or diarrhea, abdominal pain, Inability to tolerate liquids or food, cough, altered mental status or any concerns. No signs of systemic illness or infection. The patient is nontoxic-appearing on exam and vital signs are within normal limits.   I have reviewed the triage vital signs and the nursing notes. Pertinent labs &imaging results that were available during my care of the patient were reviewed by me and considered in my medical decision making (see chart for details).  After history, exam, and medical workup I feel the patient has been appropriately medically screened and is safe for discharge home. Pertinent diagnoses were discussed with the patient. Patient was given return precautions.  ED Discharge Orders         Ordered    predniSONE (DELTASONE) 20 MG tablet     07/07/19 0522    famotidine (PEPCID) 20 MG tablet  2 times daily     07/07/19 0522           Andren Bethea, MD 07/07/19 [id number]

## 2019-07-07 NOTE — ED Notes (Signed)
Pt reports starting tramadol 1 prior to rash starting. Pt was advised by MD to stop taking tramadol as this is likely source of allergen.

## 2019-07-07 NOTE — ED Triage Notes (Signed)
Pt with hive like rash. Pt was seen by [facility] x 3 days ago and was rx steroids and hydroxyzine without improvement. Pt states rash improves during day and worsens at night.

## 2019-07-19 ENCOUNTER — Ambulatory Visit: Payer: Medicaid Other | Admitting: Pediatrics

## 2019-07-20 ENCOUNTER — Ambulatory Visit (INDEPENDENT_AMBULATORY_CARE_PROVIDER_SITE_OTHER): Payer: Medicaid Other | Admitting: Pediatrics

## 2019-07-20 ENCOUNTER — Other Ambulatory Visit: Payer: Self-pay

## 2019-07-20 ENCOUNTER — Encounter: Payer: Self-pay | Admitting: Pediatrics

## 2019-07-20 VITALS — BP 110/72 | HR 100 | Temp 98.6°F | Resp 18 | Ht 63.0 in | Wt 229.0 lb

## 2019-07-20 DIAGNOSIS — L500 Allergic urticaria: Secondary | ICD-10-CM

## 2019-07-20 DIAGNOSIS — J301 Allergic rhinitis due to pollen: Secondary | ICD-10-CM

## 2019-07-20 DIAGNOSIS — J4530 Mild persistent asthma, uncomplicated: Secondary | ICD-10-CM

## 2019-07-20 MED ORDER — FLUTICASONE PROPIONATE 50 MCG/ACT NA SUSP: 2.0000 | Freq: Every day | NASAL | 5 refills | Status: DC | PRN

## 2019-07-20 MED ORDER — CETIRIZINE HCL 10 MG PO TABS: ORAL_TABLET | ORAL | 5 refills | Status: DC

## 2019-07-20 MED ORDER — MONTELUKAST SODIUM 10 MG PO TABS: ORAL_TABLET | ORAL | 5 refills | Status: DC

## 2019-07-20 NOTE — Patient Instructions (Addendum)
Environmental control of dust and mold Cetirizine 10 mg-take 1 tablet once a day if needed for runny nose , itchy eyes or itching    Fluticasone 2 sprays per nostril once a day if needed for stuffy nose  Proventil 2 puffs every 4 hours if needed for wheezing or coughing spells.  You may use Proventil 2 puffs 5 to 15 minutes before exercise Montelukast 10 mg-take 1 tablet once a day to prevent coughing or wheezing.  Add if you need Proventil more than 2 days/week or the asthma is not well controlled  Do not take Tramadol  Call us if you are not doing well on this treatment plan
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
## 2019-12-14 ENCOUNTER — Other Ambulatory Visit: Payer: Self-pay

## 2019-12-14 ENCOUNTER — Emergency Department (HOSPITAL_BASED_OUTPATIENT_CLINIC_OR_DEPARTMENT_OTHER)
Admission: EM | Admit: 2019-12-14 | Discharge: 2019-12-14 | Disposition: A | Discharge status: Home / Self Care | Payer: Medicaid Other | Attending: Emergency Medicine | Admitting: Emergency Medicine

## 2019-12-14 DIAGNOSIS — S46912A Strain of unspecified muscle, fascia and tendon at shoulder and upper arm level, left arm, initial encounter: Secondary | ICD-10-CM | POA: Insufficient documentation

## 2019-12-14 DIAGNOSIS — Y999 Unspecified external cause status: Secondary | ICD-10-CM | POA: Insufficient documentation

## 2019-12-14 DIAGNOSIS — X58XXXA Exposure to other specified factors, initial encounter: Secondary | ICD-10-CM | POA: Diagnosis not present

## 2019-12-14 DIAGNOSIS — M25512 Pain in left shoulder: Secondary | ICD-10-CM | POA: Diagnosis present

## 2019-12-14 DIAGNOSIS — Z79899 Other long term (current) drug therapy: Secondary | ICD-10-CM | POA: Insufficient documentation

## 2019-12-14 DIAGNOSIS — Y9389 Activity, other specified: Secondary | ICD-10-CM | POA: Diagnosis not present

## 2019-12-14 DIAGNOSIS — Y9289 Other specified places as the place of occurrence of the external cause: Secondary | ICD-10-CM | POA: Diagnosis not present

## 2019-12-14 MED ORDER — KETOROLAC TROMETHAMINE 60 MG/2ML IM SOLN
60.0000 mg | Freq: Once | INTRAMUSCULAR | Status: AC
  Administered 2019-12-14: 60 mg via INTRAMUSCULAR
  Filled 2019-12-14: qty 2

## 2019-12-14 MED ORDER — HYDROMORPHONE HCL 1 MG/ML IJ SOLN: 1.0000 mg | Freq: Once | INTRAMUSCULAR | Status: DC

## 2019-12-14 NOTE — ED Notes (Signed)
Went over discharge instructions with Pt. And encouraged Pt. To stay the entire shot wait time due to Pt has had reactions to medications in past.  Pt. Said she will stay. Pt. Has call bell at bedside and will call RN if needed.

## 2019-12-14 NOTE — Discharge Instructions (Addendum)
Call your provider tomorrow to discuss pain management.  Follow up with Orthopaedist as scheduled
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
## 2019-12-16 NOTE — ED Provider Notes (Signed)
[facility] EMERGENCY DEPARTMENT Provider Note   CSN: [id number] Arrival date & time: 12/14/19  2017     History Chief Complaint  Patient presents with  . Shoulder Pain    Cynthia Reese is a 39 y.o. female.  The history is provided by the patient. No language interpreter was used.  Shoulder Pain Location:  Shoulder Shoulder location:  L shoulder Injury: no   Pain details:    Quality:  Aching   Radiates to:  Does not radiate   Severity:  No pain   Timing:  Constant   Progression:  Worsening Dislocation: no   Foreign body present:  No foreign bodies Relieved by:  Nothing Worsened by:  Nothing Associated symptoms: no back pain   Risk factors: no recent illness        Past Medical History:  Diagnosis Date  . Angio-edema   . Cervical cancer ([place])   . Urticaria     There are no problems to display for this patient.   Past Surgical History:  Procedure Laterality Date  . breast cyst removal    . LEEP       OB History   No obstetric history on file.     Family History  Problem Relation Age of Onset  . Urticaria Neg Hx   . Immunodeficiency Neg Hx   . Atopy Neg Hx   . Eczema Neg Hx   . Asthma Neg Hx   . Allergic rhinitis Neg Hx   . Angioedema Neg Hx     Social History   Tobacco Use  . Smoking status: Never Smoker  . Smokeless tobacco: Never Used  Substance Use Topics  . Alcohol use: No  . Drug use: No    Home Medications Prior to Admission medications   Medication Sig Start Date End Date Taking? Authorizing Provider  cetirizine (ZYRTEC) 10 MG tablet 1 tablet once a day for runny nose, itchy eyes or itching. 07/20/19   Charlies Silvers, MD  fluticasone (FLONASE) 50 MCG/ACT nasal spray Place 2 sprays into both nostrils daily as needed (for stuffy nose). 07/20/19   Charlies Silvers, MD  gabapentin (NEURONTIN) 300 MG capsule TAKE 1 CAPSULE BY MOUTH AT BEDTIME 05/14/19   [provider]  HYDROcodone-acetaminophen (NORCO/VICODIN)  5-325 MG tablet Take 1 tablet by mouth every 6 (six) hours as needed. 03/04/19   Ward, Delice Bison, DO  meloxicam (MOBIC) 15 MG tablet TAKE 1 TABLET BY MOUTH EVERY DAY 05/14/19   [provider]  mirtazapine (REMERON) 30 MG tablet Take by mouth. 02/26/17   [provider]  montelukast (SINGULAIR) 10 MG tablet 1 tablet once a day to prevent coughing or wheezing. 07/20/19   Charlies Silvers, MD  PARoxetine (PAXIL) 40 MG tablet Take by mouth. 06/07/19   [provider]    Allergies    Chocolate, Sulfa antibiotics, and Tramadol  Review of Systems   Review of Systems  Musculoskeletal: Positive for arthralgias. Negative for back pain.  All other systems reviewed and are negative.   Physical Exam Updated Vital Signs BP (!) 138/93   Pulse 91   Temp 98.5 F (36.9 C) (Oral)   Resp 17   Ht 5\' 3"  (1.6 m)   Wt 106.6 kg   SpO2 100%   BMI 41.63 kg/m   Physical Exam Vitals and nursing note reviewed.  Constitutional:      Appearance: She is well-developed.  HENT:     Head: Normocephalic.  Cardiovascular:  Rate and Rhythm: Normal rate.  Pulmonary:     Effort: Pulmonary effort is normal.  Musculoskeletal:        General: No swelling.     Cervical back: Normal range of motion.     Comments: Tender left shoulder, pain with range of motion,  nv and ns intact   Neurological:     General: No focal deficit present.     Mental Status: She is alert and oriented to person, place, and time.  Psychiatric:        Mood and Affect: Mood normal.     ED Results / Procedures / Treatments   Labs (all labs ordered are listed, but only abnormal results are displayed) Labs Reviewed - No data to display  EKG None  Radiology No results found.  Procedures Procedures (including critical care time)  Medications Ordered in ED Medications  ketorolac (TORADOL) injection 60 mg (60 mg Intramuscular Given 12/14/19 2124)    ED Course  I have reviewed the triage vital signs  and the nursing notes.  Pertinent labs & imaging results that were available during my care of the patient were reviewed by me and considered in my medical decision making (see chart for details).    MDM Rules/Calculators/A&P                      MDM:  Pt is seeing Orthopaedist.  She has an mri scheduled for next week.  Pt  Given toradol here.   Final Clinical Impression(s) / ED Diagnoses Final diagnoses:  Strain of left shoulder, initial encounter    Rx / DC Orders ED Discharge Orders    None    An After Visit Summary was printed and given to the patient.    Fransico Meadow, Hershal Coria 12/16/19 1959    Margette Fast, MD 12/20/19 [phone]

## 2020-07-20 ENCOUNTER — Other Ambulatory Visit: Payer: Self-pay | Admitting: Pediatrics

## 2020-11-09 ENCOUNTER — Emergency Department (HOSPITAL_BASED_OUTPATIENT_CLINIC_OR_DEPARTMENT_OTHER)
Admission: EM | Admit: 2020-11-09 | Discharge: 2020-11-09 | Disposition: A | Discharge status: Home / Self Care | Payer: Medicaid Other | Attending: Emergency Medicine | Admitting: Emergency Medicine

## 2020-11-09 ENCOUNTER — Encounter (HOSPITAL_BASED_OUTPATIENT_CLINIC_OR_DEPARTMENT_OTHER): Payer: Self-pay | Admitting: Emergency Medicine

## 2020-11-09 ENCOUNTER — Other Ambulatory Visit: Payer: Self-pay

## 2020-11-09 DIAGNOSIS — M25512 Pain in left shoulder: Secondary | ICD-10-CM | POA: Insufficient documentation

## 2020-11-09 DIAGNOSIS — Z8541 Personal history of malignant neoplasm of cervix uteri: Secondary | ICD-10-CM | POA: Diagnosis not present

## 2020-11-09 DIAGNOSIS — G8929 Other chronic pain: Secondary | ICD-10-CM | POA: Diagnosis not present

## 2020-11-09 MED ORDER — IBUPROFEN 800 MG PO TABS: 800.0000 mg | ORAL_TABLET | Freq: Three times a day (TID) | ORAL | 0 refills | Status: DC

## 2020-11-09 MED ORDER — LIDOCAINE 5 % EX PTCH: 1.0000 | MEDICATED_PATCH | CUTANEOUS | 0 refills | Status: DC

## 2020-11-09 MED ORDER — DICLOFENAC SODIUM 1 % EX GEL: 2.0000 g | Freq: Four times a day (QID) | CUTANEOUS | 0 refills | Status: DC

## 2020-11-09 MED ORDER — OXYCODONE-ACETAMINOPHEN 5-325 MG PO TABS: 2.0000 | ORAL_TABLET | ORAL | 0 refills | Status: AC | PRN

## 2020-11-09 NOTE — ED Provider Notes (Signed)
[place] EMERGENCY DEPARTMENT Provider Note   CSN: [id number] Arrival date & time: 11/09/20  1821     History Chief Complaint  Patient presents with  . Arm Injury    left    [facility] is a 40 y.o. female.  HPI 40 year old female with history of cervical cancer presents to the ER with complaints of left shoulder pain.  Patient states that she had a rotator cuff tear that was repaired back in April 2021.  She was followed by Dr. Melrose Nakayama with orthopedics, has started to develop pain again as of December.  Pain has gotten progressively worse.  She can lift her shoulder up until about midline, cannot lift above her head.  She states that this feels like her prior tear.  She has occasional shooting pain down her left arm.  Denies any weakness or numbness.  There has been no significant changes in her symptoms, she called orthopedics and was able to bump up her appointment to March 8.  She states that she had run out of the hydrocodone that she had at home, has been taking gabapentin, Flexeril with little relief.  She denies any fevers or chills.  No history of neck injury, fevers, chills.    Past Medical History:  Diagnosis Date  . Angio-edema   . Cervical cancer ([place])   . Urticaria     There are no problems to display for this patient.   Past Surgical History:  Procedure Laterality Date  . breast cyst removal    . LEEP       OB History   No obstetric history on file.     Family History  Problem Relation Age of Onset  . Urticaria Neg Hx   . Immunodeficiency Neg Hx   . Atopy Neg Hx   . Eczema Neg Hx   . Asthma Neg Hx   . Allergic rhinitis Neg Hx   . Angioedema Neg Hx     Social History   Tobacco Use  . Smoking status: Never Smoker  . Smokeless tobacco: Never Used  Vaping Use  . Vaping Use: Never used  Substance Use Topics  . Alcohol use: No  . Drug use: No    Home Medications Prior to Admission medications   Medication Sig Start Date End  Date Taking? Authorizing Provider  diclofenac Sodium (VOLTAREN) 1 % GEL Apply 2 g topically 4 (four) times daily. 11/09/20  Yes Garald Balding, PA-C  ibuprofen (ADVIL) 800 MG tablet Take 1 tablet (800 mg total) by mouth 3 (three) times daily. 11/09/20  Yes Sharyn Lull A, PA-C  lidocaine (LIDODERM) 5 % Place 1 patch onto the skin daily. Remove & Discard patch within 12 hours or as directed by MD 11/09/20  Yes Garald Balding, PA-C  oxyCODONE-acetaminophen (PERCOCET/ROXICET) 5-325 MG tablet Take 2 tablets by mouth every 4 (four) hours as needed for up to 5 days for severe pain. 11/09/20 11/14/20 Yes Garald Balding, PA-C  cetirizine (ZYRTEC) 10 MG tablet 1 tablet once a day for runny nose, itchy eyes or itching. 07/20/19   Charlies Silvers, MD  fluticasone (FLONASE) 50 MCG/ACT nasal spray Place 2 sprays into both nostrils daily as needed (for stuffy nose). 07/20/19   Charlies Silvers, MD  gabapentin (NEURONTIN) 300 MG capsule TAKE 1 CAPSULE BY MOUTH AT BEDTIME 05/14/19   [provider]  HYDROcodone-acetaminophen (NORCO/VICODIN) 5-325 MG tablet Take 1 tablet by mouth every 6 (six) hours as needed. 03/04/19  Ward, Delice Bison, DO  meloxicam (MOBIC) 15 MG tablet TAKE 1 TABLET BY MOUTH EVERY DAY 05/14/19   [provider]  mirtazapine (REMERON) 30 MG tablet Take by mouth. 02/26/17   [provider]  montelukast (SINGULAIR) 10 MG tablet 1 tablet once a day to prevent coughing or wheezing. 07/20/19   Charlies Silvers, MD  PARoxetine (PAXIL) 40 MG tablet Take by mouth. 06/07/19   [provider]    Allergies    Chocolate, Sulfa antibiotics, and Tramadol  Review of Systems   Review of Systems  Constitutional: Negative for chills and fever.  Musculoskeletal: Positive for arthralgias.  Neurological: Negative for weakness and numbness.    Physical Exam Updated Vital Signs BP 125/81 (BP Location: Right Arm)   Pulse 85   Temp 98.4 F (36.9 C) (Oral)   Resp 18   Ht 5\' 4"   (1.626 m)   Wt 106.6 kg   SpO2 99%   BMI 40.34 kg/m   Physical Exam Vitals and nursing note reviewed.  Constitutional:      General: She is not in acute distress.    Appearance: She is well-developed and well-nourished. She is not ill-appearing or diaphoretic.  HENT:     Head: Normocephalic and atraumatic.  Eyes:     Conjunctiva/sclera: Conjunctivae normal.  Cardiovascular:     Rate and Rhythm: Normal rate and regular rhythm.     Heart sounds: No murmur heard.   Pulmonary:     Effort: Pulmonary effort is normal. No respiratory distress.     Breath sounds: Normal breath sounds.  Abdominal:     Palpations: Abdomen is soft.     Tenderness: There is no abdominal tenderness.  Musculoskeletal:        General: Tenderness present. No swelling, deformity, signs of injury or edema. Normal range of motion.     Cervical back: Neck supple.     Comments: No significant overlying erythema, redness, warmth to the left shoulder.  Unable to lift above midline.  Neurovascularly intact.  5/5 grip strength in upper extremities bilaterally.  Mild tenderness to palpation over the deltoid.  No noticeable step-offs or crepitus.  She has no C, T, L-spine tenderness.  Skin:    General: Skin is warm and dry.     Findings: No erythema.  Neurological:     General: No focal deficit present.     Mental Status: She is alert and oriented to person, place, and time.     Sensory: No sensory deficit.     Motor: No weakness.  Psychiatric:        Mood and Affect: Mood and affect normal.     ED Results / Procedures / Treatments   Labs (all labs ordered are listed, but only abnormal results are displayed) Labs Reviewed - No data to display  EKG None  Radiology No results found.  Procedures Procedures   Medications Ordered in ED Medications - No data to display  ED Course  I have reviewed the triage vital signs and the nursing notes.  Pertinent labs & imaging results that were available during  my care of the patient were reviewed by me and considered in my medical decision making (see chart for details).    MDM Rules/Calculators/A&P                         40 year old female with history of left shoulder pain.  History of same, history of rotator cuff repair.  She reports similar pain as to before her surgery since December.  Reports poorly controlled pain.  Vitals on arrival overall reassuring.  On exam, patient cannot lift arm above midline, however no significant overlying erythema, warmth, she still has some intact range of motion.  She is neurovascularly intact on exam.  Equal strength in both upper extremities.  No C, T spine tenderness.  Patient is scheduled to follow-up with Dr. Melrose Nakayama on March 8.  I offered the patient x-rays, however she states that last time the diagnosis was made with an MRI and she "does not want to waste her time".  She likely did reinjure her rotator cuff, however no indication for emergent MRI imaging at this time she has no evidence of neurovascular compromise, low suspicion for cervical pathology as cause of her symptoms.  She has no evidence of septic joint.  No known falls or injuries, no evidence of shoulder dislocation.  As discussed with the patient, will provide shot of Toradol here, sling.  Also discussed taking ibuprofen for pain, with using Percocet for breakthrough pain.  Patient denies any history of renal disease.  Encouraged Lidoderm patches and Voltaren gel as well.  Discussed return precautions.  Encouraged Ortho follow-up.  She voiced understanding and is agreeable.  Stable for discharge at this time  Final Clinical Impression(s) / ED Diagnoses Final diagnoses:  Chronic left shoulder pain    Rx / DC Orders ED Discharge Orders         Ordered    ibuprofen (ADVIL) 800 MG tablet  3 times daily        11/09/20 2033    oxyCODONE-acetaminophen (PERCOCET/ROXICET) 5-325 MG tablet  Every 4 hours PRN        11/09/20 2033    lidocaine  (LIDODERM) 5 %  Every 24 hours        11/09/20 2034    diclofenac Sodium (VOLTAREN) 1 % GEL  4 times daily        11/09/20 2034           Lyndel Safe 11/09/20 2035    Little, Wenda Overland, MD 11/10/20 [phone]

## 2020-11-09 NOTE — Discharge Instructions (Addendum)
Please take ibuprofen up to 3 times daily as needed for pain.  You may use the Percocet for breakthrough pain.  You may also apply the topical anti-inflammatory cream Voltaren and the Lidoderm patches as needed.  Apply ice, keep your arm in a sling as much as possible.  Please make sure to follow-up with orthopedics.

## 2020-11-09 NOTE — ED Triage Notes (Signed)
Pt c/o left arm pain and unable to lift arm completely. Pt has been taking meds at home gabapentin, a muscle relaxer, and hydrocodone with only relief to get some sleep. Pt states that she had surgery April 2021, was released from PT September 2021, started having pain again in December. Pt is to see ortho who preformed surgery march 8th but is not able to tolerate the pain at this time. Pt aaox3, ambulatory with steady gait, VSS, GCS 15, NAD noted in triage.

## 2021-01-25 ENCOUNTER — Inpatient Hospital Stay (HOSPITAL_BASED_OUTPATIENT_CLINIC_OR_DEPARTMENT_OTHER)
Admission: EM | Admit: 2021-01-25 | Discharge: 2021-01-30 | DRG: 872 | Disposition: A | Discharge status: Home / Self Care | Payer: Medicaid Other | Attending: Internal Medicine | Admitting: Internal Medicine

## 2021-01-25 ENCOUNTER — Encounter (HOSPITAL_BASED_OUTPATIENT_CLINIC_OR_DEPARTMENT_OTHER): Payer: Self-pay | Admitting: *Deleted

## 2021-01-25 ENCOUNTER — Other Ambulatory Visit: Payer: Self-pay

## 2021-01-25 ENCOUNTER — Emergency Department (HOSPITAL_BASED_OUTPATIENT_CLINIC_OR_DEPARTMENT_OTHER): Payer: Medicaid Other

## 2021-01-25 DIAGNOSIS — E876 Hypokalemia: Secondary | ICD-10-CM

## 2021-01-25 DIAGNOSIS — N10 Acute tubulo-interstitial nephritis: Secondary | ICD-10-CM

## 2021-01-25 DIAGNOSIS — M25512 Pain in left shoulder: Secondary | ICD-10-CM | POA: Diagnosis present

## 2021-01-25 DIAGNOSIS — F32A Depression, unspecified: Secondary | ICD-10-CM | POA: Diagnosis present

## 2021-01-25 DIAGNOSIS — J45909 Unspecified asthma, uncomplicated: Secondary | ICD-10-CM | POA: Diagnosis present

## 2021-01-25 DIAGNOSIS — R350 Frequency of micturition: Secondary | ICD-10-CM | POA: Diagnosis present

## 2021-01-25 DIAGNOSIS — G8929 Other chronic pain: Secondary | ICD-10-CM | POA: Diagnosis present

## 2021-01-25 DIAGNOSIS — R262 Difficulty in walking, not elsewhere classified: Secondary | ICD-10-CM | POA: Diagnosis present

## 2021-01-25 DIAGNOSIS — Z791 Long term (current) use of non-steroidal anti-inflammatories (NSAID): Secondary | ICD-10-CM

## 2021-01-25 DIAGNOSIS — A4151 Sepsis due to Escherichia coli [E. coli]: Principal | ICD-10-CM | POA: Diagnosis present

## 2021-01-25 DIAGNOSIS — Z6841 Body Mass Index (BMI) 40.0 and over, adult: Secondary | ICD-10-CM

## 2021-01-25 DIAGNOSIS — Z882 Allergy status to sulfonamides status: Secondary | ICD-10-CM

## 2021-01-25 DIAGNOSIS — Z20822 Contact with and (suspected) exposure to covid-19: Secondary | ICD-10-CM | POA: Diagnosis present

## 2021-01-25 DIAGNOSIS — A419 Sepsis, unspecified organism: Principal | ICD-10-CM | POA: Diagnosis present

## 2021-01-25 DIAGNOSIS — Z885 Allergy status to narcotic agent status: Secondary | ICD-10-CM

## 2021-01-25 DIAGNOSIS — Z79899 Other long term (current) drug therapy: Secondary | ICD-10-CM

## 2021-01-25 DIAGNOSIS — N12 Tubulo-interstitial nephritis, not specified as acute or chronic: Secondary | ICD-10-CM | POA: Diagnosis present

## 2021-01-25 DIAGNOSIS — N179 Acute kidney failure, unspecified: Secondary | ICD-10-CM | POA: Diagnosis present

## 2021-01-25 DIAGNOSIS — Z8541 Personal history of malignant neoplasm of cervix uteri: Secondary | ICD-10-CM

## 2021-01-25 DIAGNOSIS — M549 Dorsalgia, unspecified: Secondary | ICD-10-CM

## 2021-01-25 LAB — COMPREHENSIVE METABOLIC PANEL
ALT: 25 U/L (ref 0–44)
AST: 22 U/L (ref 15–41)
Albumin: 3.1 g/dL — ABNORMAL LOW (ref 3.5–5.0)
Alkaline Phosphatase: 133 U/L — ABNORMAL HIGH (ref 38–126)
Anion gap: 11 (ref 5–15)
BUN: 13 mg/dL (ref 6–20)
CO2: 23 mmol/L (ref 22–32)
Calcium: 9.3 mg/dL (ref 8.9–10.3)
Chloride: 101 mmol/L (ref 98–111)
Creatinine, Ser: 1.38 mg/dL — ABNORMAL HIGH (ref 0.44–1.00)
GFR, Estimated: 51 mL/min — ABNORMAL LOW (ref >60)
Glucose, Bld: 114 mg/dL — ABNORMAL HIGH (ref 70–99)
Potassium: 2.9 mmol/L — ABNORMAL LOW (ref 3.5–5.1)
Sodium: 135 mmol/L (ref 135–145)
Total Bilirubin: 1.3 mg/dL — ABNORMAL HIGH (ref 0.3–1.2)
Total Protein: 7.6 g/dL (ref 6.5–8.1)

## 2021-01-25 LAB — PREGNANCY, URINE: Preg Test, Ur: NEGATIVE

## 2021-01-25 LAB — URINALYSIS, ROUTINE W REFLEX MICROSCOPIC
Bilirubin Urine: NEGATIVE
Glucose, UA: NEGATIVE mg/dL
Ketones, ur: NEGATIVE mg/dL
Nitrite: NEGATIVE
Protein, ur: 30 mg/dL — AB
Specific Gravity, Urine: <1.005 — ABNORMAL LOW (ref 1.005–1.030)
pH: 6.5 (ref 5.0–8.0)

## 2021-01-25 LAB — CBC WITH DIFFERENTIAL/PLATELET
Abs Immature Granulocytes: 1.16 10*3/uL — ABNORMAL HIGH (ref 0.00–0.07)
Basophils Absolute: 0.1 10*3/uL (ref 0.0–0.1)
Basophils Relative: 0 %
Eosinophils Absolute: 0 10*3/uL (ref 0.0–0.5)
Eosinophils Relative: 0 %
HCT: 38.6 % (ref 36.0–46.0)
Hemoglobin: 13.2 g/dL (ref 12.0–15.0)
Immature Granulocytes: 4 %
Lymphocytes Relative: 7 %
Lymphs Abs: 1.8 10*3/uL (ref 0.7–4.0)
MCH: 29.7 pg (ref 26.0–34.0)
MCHC: 34.2 g/dL (ref 30.0–36.0)
MCV: 86.9 fL (ref 80.0–100.0)
Monocytes Absolute: 2.9 10*3/uL — ABNORMAL HIGH (ref 0.1–1.0)
Monocytes Relative: 11 %
Neutro Abs: 20.2 10*3/uL — ABNORMAL HIGH (ref 1.7–7.7)
Neutrophils Relative %: 78 %
Platelets: 230 10*3/uL (ref 150–400)
RBC: 4.44 MIL/uL (ref 3.87–5.11)
RDW: 14.1 % (ref 11.5–15.5)
Smear Review: NORMAL
WBC: 26.1 10*3/uL — ABNORMAL HIGH (ref 4.0–10.5)
nRBC: 0 % (ref 0.0–0.2)

## 2021-01-25 LAB — URINALYSIS, MICROSCOPIC (REFLEX): WBC, UA: >50 WBC/hpf (ref 0–5)

## 2021-01-25 LAB — LACTIC ACID, PLASMA
Lactic Acid, Venous: 0.9 mmol/L (ref 0.5–1.9)
Lactic Acid, Venous: 0.9 mmol/L (ref 0.5–1.9)

## 2021-01-25 LAB — RESP PANEL BY RT-PCR (FLU A&B, COVID) ARPGX2
Influenza A by PCR: NEGATIVE
Influenza B by PCR: NEGATIVE
SARS Coronavirus 2 by RT PCR: NEGATIVE

## 2021-01-25 MED ORDER — POTASSIUM CHLORIDE 10 MEQ/100ML IV SOLN
10.0000 meq | INTRAVENOUS | Status: AC
  Administered 2021-01-25 (×4): 10 meq via INTRAVENOUS
  Filled 2021-01-25 (×2): qty 100

## 2021-01-25 MED ORDER — SODIUM CHLORIDE 0.9 % IV BOLUS
1000.0000 mL | Freq: Once | INTRAVENOUS | Status: AC
  Administered 2021-01-25: 1000 mL via INTRAVENOUS

## 2021-01-25 MED ORDER — ONDANSETRON HCL 4 MG/2ML IJ SOLN
4.0000 mg | Freq: Once | INTRAMUSCULAR | Status: AC
  Administered 2021-01-25: 4 mg via INTRAVENOUS
  Filled 2021-01-25: qty 2

## 2021-01-25 MED ORDER — HYDROMORPHONE HCL 1 MG/ML IJ SOLN
0.5000 mg | Freq: Once | INTRAMUSCULAR | Status: AC
  Administered 2021-01-25: 0.5 mg via INTRAVENOUS
  Filled 2021-01-25: qty 1

## 2021-01-25 MED ORDER — SODIUM CHLORIDE 0.9 % IV SOLN
1.0000 g | Freq: Once | INTRAVENOUS | Status: AC
  Administered 2021-01-25: 1 g via INTRAVENOUS
  Filled 2021-01-25: qty 10

## 2021-01-25 MED ORDER — SODIUM CHLORIDE 0.9 % IV SOLN: INTRAVENOUS | Status: DC

## 2021-01-25 MED ORDER — ACETAMINOPHEN 500 MG PO TABS
1000.0000 mg | ORAL_TABLET | Freq: Once | ORAL | Status: AC
  Administered 2021-01-25: 1000 mg via ORAL
  Filled 2021-01-25: qty 2

## 2021-01-25 MED ORDER — SODIUM CHLORIDE 0.9 % IV SOLN: INTRAVENOUS | Status: DC | PRN

## 2021-01-25 NOTE — ED Notes (Signed)
ED provider at bedside.

## 2021-01-25 NOTE — ED Triage Notes (Signed)
Bilateral back pain with radiation down bilateral legs. Denies known injury. Ambulatory.

## 2021-01-25 NOTE — ED Provider Notes (Signed)
[facility] EMERGENCY DEPARTMENT Provider Note   CSN: [id number] Arrival date & time: 01/25/21  1728     History Chief Complaint  Patient presents with  . Back Pain    Cynthia Reese is a 40 y.o. female.  Patient presents the emergency department today for evaluation of lower back pain as well as subjective fever and chills, abdominal pain.  Her symptoms started on Sunday, 01/21/2021.  Symptoms were more mild at that time.  It has progressed over the past several days.  She states that she has just wanted to lay in bed.  Movement makes the pain worse.  Pain is across her bilateral lower back with radiation into both legs.  She feels generally weak in her legs but had difficulty walking today, but was able to drive herself to the ED.  She states that she feels "hot and cold".  No upper respiratory infection symptoms.  No neck pain or upper back pain.  She denies cough or shortness of breath.  She has abdominal pain across her lower abdomen and she states that her skin "feels warm" in this area. She reports several episodes of vomiting the past day or two.  No hematuria or irritative UTI symptoms including dysuria, increased frequency or urgency.  She denies constipation or diarrhea.  Patient with history of uterine cancer treated with LEEP procedure.  This was years ago.  Patient has chronic left shoulder problems and has been prescribed Vicodin in the past.  She was taking this just to help her fall asleep.  She has never had back pain like this before. Patient denies warning symptoms of back pain including: fecal incontinence, urinary retention or overflow incontinence, night sweats, waking from sleep with back pain, unexplained weight loss, IVDU, recent trauma.           Past Medical History:  Diagnosis Date  . Angio-edema   . Cervical cancer ([place])   . Urticaria     There are no problems to display for this patient.   Past Surgical History:  Procedure Laterality Date   . breast cyst removal    . LEEP       OB History   No obstetric history on file.     Family History  Problem Relation Age of Onset  . Urticaria Neg Hx   . Immunodeficiency Neg Hx   . Atopy Neg Hx   . Eczema Neg Hx   . Asthma Neg Hx   . Allergic rhinitis Neg Hx   . Angioedema Neg Hx     Social History   Tobacco Use  . Smoking status: Never Smoker  . Smokeless tobacco: Never Used  Vaping Use  . Vaping Use: Never used  Substance Use Topics  . Alcohol use: No  . Drug use: No    Home Medications Prior to Admission medications   Medication Sig Start Date End Date Taking? Authorizing Provider  cetirizine (ZYRTEC) 10 MG tablet 1 tablet once a day for runny nose, itchy eyes or itching. 07/20/19   Charlies Silvers, MD  diclofenac Sodium (VOLTAREN) 1 % GEL Apply 2 g topically 4 (four) times daily. 11/09/20   Garald Balding, PA-C  fluticasone (FLONASE) 50 MCG/ACT nasal spray Place 2 sprays into both nostrils daily as needed (for stuffy nose). 07/20/19   Charlies Silvers, MD  gabapentin (NEURONTIN) 300 MG capsule TAKE 1 CAPSULE BY MOUTH AT BEDTIME 05/14/19   [provider]  HYDROcodone-acetaminophen (NORCO/VICODIN) 5-325 MG tablet Take 1  tablet by mouth every 6 (six) hours as needed. 03/04/19   Ward, Delice Bison, DO  ibuprofen (ADVIL) 800 MG tablet Take 1 tablet (800 mg total) by mouth 3 (three) times daily. 11/09/20   Garald Balding, PA-C  lidocaine (LIDODERM) 5 % Place 1 patch onto the skin daily. Remove & Discard patch within 12 hours or as directed by MD 11/09/20   Garald Balding, PA-C  meloxicam (MOBIC) 15 MG tablet TAKE 1 TABLET BY MOUTH EVERY DAY 05/14/19   [provider]  mirtazapine (REMERON) 30 MG tablet Take by mouth. 02/26/17   [provider]  montelukast (SINGULAIR) 10 MG tablet 1 tablet once a day to prevent coughing or wheezing. 07/20/19   Charlies Silvers, MD  PARoxetine (PAXIL) 40 MG tablet Take by mouth. 06/07/19   [provider]     Allergies    Chocolate, Sulfa antibiotics, and Tramadol  Review of Systems   Review of Systems  Constitutional: Positive for chills and fever. Negative for unexpected weight change.  HENT: Negative for congestion, rhinorrhea and sore throat.   Eyes: Negative for visual disturbance.  Respiratory: Negative for cough and shortness of breath.   Gastrointestinal: Positive for abdominal pain, nausea and vomiting. Negative for constipation.       Negative for fecal incontinence.   Genitourinary: Negative for dysuria, flank pain, hematuria, pelvic pain, vaginal bleeding and vaginal discharge.       Negative for urinary incontinence or retention.  Musculoskeletal: Positive for back pain.  Neurological: Positive for weakness (generalized, same both sides) and numbness (decreased sensation in legs). Negative for facial asymmetry, light-headedness and headaches.       Denies saddle paresthesias.    Physical Exam Updated Vital Signs BP 139/86 (BP Location: Right Arm)   Pulse (!) 121   Temp 99.2 F (37.3 C) (Oral)   Resp 20   Ht 5\' 3"  (1.6 m)   Wt 108 kg   SpO2 98%   BMI 42.16 kg/m   Physical Exam Vitals and nursing note reviewed.  Constitutional:      General: She is in acute distress (tearful).     Appearance: She is well-developed.  HENT:     Head: Normocephalic and atraumatic.     Nose: No congestion.     Mouth/Throat:     Mouth: Mucous membranes are moist.     Pharynx: Oropharynx is clear.  Eyes:     Conjunctiva/sclera: Conjunctivae normal.  Cardiovascular:     Rate and Rhythm: Tachycardia present.  Pulmonary:     Effort: Pulmonary effort is normal.  Abdominal:     Palpations: Abdomen is soft.     Tenderness: There is no abdominal tenderness. There is no guarding or rebound.     Comments: Patient reports pain in the suprapubic and left lower quadrant area.  She does not appear to be particularly uncomfortable when I push in this area.  Musculoskeletal:         General: Normal range of motion.     Cervical back: Normal range of motion and neck supple.     Comments: No step-off noted with palpation of spine.  Patient does not have tenderness in the cervical or thoracic areas.  She seems to have tenderness to palpation across her lower back, broadly, bilaterally.  No erythema or redness.  Skin:    General: Skin is warm and dry.     Findings: No rash.  Neurological:     Mental Status: She is  alert.     Sensory: No sensory deficit.     Deep Tendon Reflexes: Reflexes are normal and symmetric.     Comments: 5/5 strength in entire lower extremities bilaterally. No sensation deficit.      ED Results / Procedures / Treatments   Labs (all labs ordered are listed, but only abnormal results are displayed) Labs Reviewed  COMPREHENSIVE METABOLIC PANEL - Abnormal; Notable for the following components:      Result Value   Potassium 2.9 (*)    Glucose, Bld 114 (*)    Creatinine, Ser 1.38 (*)    Albumin 3.1 (*)    Alkaline Phosphatase 133 (*)    Total Bilirubin 1.3 (*)    GFR, Estimated 51 (*)    All other components within normal limits  CBC WITH DIFFERENTIAL/PLATELET - Abnormal; Notable for the following components:   WBC 26.1 (*)    Neutro Abs 20.2 (*)    Monocytes Absolute 2.9 (*)    Abs Immature Granulocytes 1.16 (*)    All other components within normal limits  URINALYSIS, ROUTINE W REFLEX MICROSCOPIC - Abnormal; Notable for the following components:   APPearance CLOUDY (*)    Specific Gravity, Urine <1.005 (*)    Hgb urine dipstick LARGE (*)    Protein, ur 30 (*)    Leukocytes,Ua LARGE (*)    All other components within normal limits  URINALYSIS, MICROSCOPIC (REFLEX) - Abnormal; Notable for the following components:   Bacteria, UA MANY (*)    All other components within normal limits  RESP PANEL BY RT-PCR (FLU A&B, COVID) ARPGX2  CULTURE, BLOOD (SINGLE)  URINE CULTURE  CULTURE, BLOOD (SINGLE)  LACTIC ACID, PLASMA  LACTIC ACID, PLASMA   PREGNANCY, URINE    EKG EKG Interpretation  Date/Time:  Thursday Jan 25 2021 19:15:21 EDT Ventricular Rate:  106 PR Interval:  110 QRS Duration: 84 QT Interval:  305 QTC Calculation: 405 R Axis:   35 Text Interpretation: Sinus tachycardia Left ventricular hypertrophy Nonspecific T abnormalities, inferior leads No significant change since last tracing Confirmed by Richardean CanalYao, David H [phone]([id number]) on 01/25/2021 8:18:12 PM   Radiology CT ABDOMEN PELVIS WO CONTRAST  Result Date: 01/25/2021 CLINICAL DATA:  Low back pain, no known injury, initial encounter EXAM: CT ABDOMEN AND PELVIS WITHOUT CONTRAST TECHNIQUE: Multidetector CT imaging of the abdomen and pelvis was performed following the standard protocol without IV contrast. COMPARISON:  None. FINDINGS: Lower chest: Mild left basilar atelectasis is noted. Hepatobiliary: No focal liver abnormality is seen. No gallstones, gallbladder wall thickening, or biliary dilatation. Pancreas: Unremarkable. No pancreatic ductal dilatation or surrounding inflammatory changes. Spleen: Normal in size without focal abnormality. Adrenals/Urinary Tract: Adrenal glands are within normal limits. Kidneys are well visualized bilaterally. No renal calculi are noted. Mild perinephric stranding is seen. The ureters demonstrate some mild surrounding inflammatory change as well. No obstructive changes are seen. The bladder is partially distended. Stomach/Bowel: Appendix is within normal limits. No obstructive or inflammatory changes of the colon are seen. Small bowel and stomach are within normal limits. Vascular/Lymphatic: Aortic atherosclerosis. No enlarged abdominal or pelvic lymph nodes. Reproductive: Uterus and bilateral adnexa are unremarkable. Other: Minimal free fluid likely physiologic in nature within the pelvic cul-de-sac. Musculoskeletal: No acute or significant osseous findings. IMPRESSION: Mild perinephric stranding and Periureteral stranding without definitive obstructive  change. Possibility of underlying pyelonephritis would deserve consideration. Correlation with laboratory values is recommended. Normal-appearing appendix. Mild left basilar atelectasis. Electronically Signed   By: Eulah PontMark  Lukens M.D.  On: 01/25/2021 21:02   CT L-SPINE NO CHARGE  Result Date: 01/25/2021 CLINICAL DATA:  Initial evaluation for back pain with extension into the lower extremities. EXAM: CT LUMBAR SPINE WITHOUT CONTRAST TECHNIQUE: Multidetector CT imaging of the lumbar spine was performed without intravenous contrast administration. Multiplanar CT image reconstructions were also generated. COMPARISON:  Prior radiograph from 03/04/2019 FINDINGS: Segmentation: Standard. Lowest well-formed disc space labeled the L5-S1 level. Alignment: Physiologic with preservation of the normal lumbar lordosis. No listhesis. Vertebrae: Vertebral body height maintained without acute or chronic fracture. Visualized sacrum and pelvis intact. SI joints symmetric and within normal limits. No discrete or worrisome osseous lesions. Paraspinal and other soft tissues: Paraspinous soft tissues demonstrate no acute finding. Mild aorto bi-iliac atherosclerotic disease. Duplicated IVC noted. Disc levels: L1-2:  Unremarkable. L2-3:  Unremarkable. L3-4: Negative interspace. Minimal facet hypertrophy. No canal or foraminal stenosis. L4-5: Minimal disc bulge with facet hypertrophy. No spinal stenosis. Foramina remain patent. L5-S1: Shallow broad-based central disc protrusion (series 4, image 105). Protruding disc closely approximates both of the descending S1 nerve roots without frank neural impingement or displacement. Mild facet hypertrophy. No significant spinal stenosis. Foramina remain patent. IMPRESSION: 1. No acute abnormality within the lumbar spine. 2. Shallow broad-based central disc protrusion at L5-S1, closely approximating both of the descending S1 nerve roots without frank neural impingement or displacement. 3. No other  significant stenosis or evidence for neural impingement within the lumbar spine. 4. Aortic Atherosclerosis (ICD10-I70.0). Electronically Signed   By: Jeannine Boga M.D.   On: 01/25/2021 21:22   DG Chest Port 1 View  Result Date: 01/25/2021 CLINICAL DATA:  Questionable sepsis - evaluate for abnormality Patient reports back pain radiating down both legs. EXAM: PORTABLE CHEST 1 VIEW COMPARISON:  Radiograph 03/18/2018 FINDINGS: Overlying monitoring devices partially obscure evaluation. Lung volumes are low. Mild bibasilar atelectasis. No confluent consolidation. Normal heart size and mediastinal contours. No pleural fluid or pneumothorax. No pulmonary edema. No acute osseous abnormalities are seen. IMPRESSION: Low lung volumes with mild bibasilar atelectasis. Electronically Signed   By: Keith Rake M.D.   On: 01/25/2021 20:18    Procedures Procedures   Medications Ordered in ED Medications  potassium chloride 10 mEq in 100 mL IVPB (10 mEq Intravenous New Bag/Given 01/25/21 2107)  0.9 %  sodium chloride infusion ( Intravenous New Bag/Given 01/25/21 2103)  sodium chloride 0.9 % bolus 1,000 mL (has no administration in time range)  0.9 %  sodium chloride infusion (has no administration in time range)  sodium chloride 0.9 % bolus 1,000 mL (0 mLs Intravenous Stopped 01/25/21 2039)  HYDROmorphone (DILAUDID) injection 0.5 mg (0.5 mg Intravenous Given 01/25/21 1929)  ondansetron (ZOFRAN) injection 4 mg (4 mg Intravenous Given 01/25/21 1928)  acetaminophen (TYLENOL) tablet 1,000 mg (1,000 mg Oral Given 01/25/21 1927)  cefTRIAXone (ROCEPHIN) 1 g in sodium chloride 0.9 % 100 mL IVPB (1 g Intravenous New Bag/Given 01/25/21 2105)    ED Course  I have reviewed the triage vital signs and the nursing notes.  Pertinent labs & imaging results that were available during my care of the patient were reviewed by me and considered in my medical decision making (see chart for details).  Patient seen and  examined. Work-up initiated.  I am concerned about undifferentiated sepsis given the patient's tachycardia.  When I checked her oral temperature in the room it was 100.0 F.  Patient does seem to be in considerable discomfort.  She states that she can call for a ride home.  She  will be given Tylenol, pain control, IV fluids.  Will check sepsis labs including urine.  She does not have any significant risk factors for back pain including IV drug use.  Vital signs reviewed and are as follows: BP 139/86 (BP Location: Right Arm)   Pulse (!) 121   Temp 99.2 F (37.3 C) (Oral)   Resp 20   Ht 5\' 3"  (1.6 m)   Wt 108 kg   SpO2 98%   BMI 42.16 kg/m   Work-up significant for elevated white blood cell count, normal lactate, hypokalemia of 2.9.  UA suggestive of infection.  As patient with severe back pain, no irritative lower UTI symptoms to support pyelonephritis, will obtain CT imaging to confirm diagnosis.  Patient discussed with Dr. Darl Householder who is in agreement with plan.  CT imaging is consistent with bilateral pyelonephritis.  She has a little bit of a disc bulge at L5/S1 but doubt this is causing significant symptoms at this time.  Patient started on Rocephin, IV potassium.  Plan for admission.  Patient updated on results.  She is in agreement with plan for admission.  Discussed case with Dr. Clearence Ped who accepts patient for admission.   BP 122/71 (BP Location: Left Arm)   Pulse (!) 104   Temp 98.9 F (37.2 C) (Oral)   Resp 20   Ht 5\' 3"  (1.6 m)   Wt 108 kg   SpO2 99%   BMI 42.16 kg/m    CRITICAL CARE Performed by: Carlisle Cater PA-C Total critical care time: 35 minutes Critical care time was exclusive of separately billable procedures and treating other patients. Critical care was necessary to treat or prevent imminent or life-threatening deterioration. Critical care was time spent personally by me on the following activities: development of treatment plan with patient and/or  surrogate as well as nursing, discussions with consultants, evaluation of patient's response to treatment, examination of patient, obtaining history from patient or surrogate, ordering and performing treatments and interventions, ordering and review of laboratory studies, ordering and review of radiographic studies, pulse oximetry and re-evaluation of patient's condition.      MDM Rules/Calculators/A&P                          Patient with sepsis likely due to acute pyelonephritis.  Plan for admission.  Also with hypokalemia requiring IV replacement.   Final Clinical Impression(s) / ED Diagnoses Final diagnoses:  Back pain  Sepsis without acute organ dysfunction, due to unspecified organism [facility])  Acute pyelonephritis    Rx / DC Orders ED Discharge Orders    None       Carlisle Cater, Hershal Coria 01/25/21 2201    Drenda Freeze, MD 01/25/21 2320

## 2021-01-25 NOTE — ED Notes (Signed)
Patient transported to Ultrasound 

## 2021-01-26 ENCOUNTER — Encounter (HOSPITAL_COMMUNITY): Payer: Self-pay | Admitting: Family Medicine

## 2021-01-26 DIAGNOSIS — Z791 Long term (current) use of non-steroidal anti-inflammatories (NSAID): Secondary | ICD-10-CM | POA: Diagnosis not present

## 2021-01-26 DIAGNOSIS — E876 Hypokalemia: Secondary | ICD-10-CM

## 2021-01-26 DIAGNOSIS — N10 Acute tubulo-interstitial nephritis: Secondary | ICD-10-CM | POA: Diagnosis not present

## 2021-01-26 DIAGNOSIS — R262 Difficulty in walking, not elsewhere classified: Secondary | ICD-10-CM | POA: Diagnosis not present

## 2021-01-26 DIAGNOSIS — M25512 Pain in left shoulder: Secondary | ICD-10-CM | POA: Diagnosis not present

## 2021-01-26 DIAGNOSIS — A4151 Sepsis due to Escherichia coli [E. coli]: Secondary | ICD-10-CM | POA: Diagnosis not present

## 2021-01-26 DIAGNOSIS — Z20822 Contact with and (suspected) exposure to covid-19: Secondary | ICD-10-CM | POA: Diagnosis not present

## 2021-01-26 DIAGNOSIS — A419 Sepsis, unspecified organism: Secondary | ICD-10-CM

## 2021-01-26 DIAGNOSIS — F32A Depression, unspecified: Secondary | ICD-10-CM | POA: Diagnosis not present

## 2021-01-26 DIAGNOSIS — G8929 Other chronic pain: Secondary | ICD-10-CM | POA: Diagnosis not present

## 2021-01-26 DIAGNOSIS — Z79899 Other long term (current) drug therapy: Secondary | ICD-10-CM | POA: Diagnosis not present

## 2021-01-26 DIAGNOSIS — Z6841 Body Mass Index (BMI) 40.0 and over, adult: Secondary | ICD-10-CM | POA: Diagnosis not present

## 2021-01-26 DIAGNOSIS — Z8541 Personal history of malignant neoplasm of cervix uteri: Secondary | ICD-10-CM | POA: Diagnosis not present

## 2021-01-26 DIAGNOSIS — R350 Frequency of micturition: Secondary | ICD-10-CM | POA: Diagnosis not present

## 2021-01-26 DIAGNOSIS — J45909 Unspecified asthma, uncomplicated: Secondary | ICD-10-CM | POA: Diagnosis not present

## 2021-01-26 DIAGNOSIS — Z885 Allergy status to narcotic agent status: Secondary | ICD-10-CM | POA: Diagnosis not present

## 2021-01-26 DIAGNOSIS — N179 Acute kidney failure, unspecified: Secondary | ICD-10-CM | POA: Diagnosis not present

## 2021-01-26 DIAGNOSIS — Z882 Allergy status to sulfonamides status: Secondary | ICD-10-CM | POA: Diagnosis not present

## 2021-01-26 LAB — BLOOD CULTURE ID PANEL (REFLEXED) - BCID2

## 2021-01-26 LAB — CBC WITH DIFFERENTIAL/PLATELET
Abs Immature Granulocytes: 0.72 10*3/uL — ABNORMAL HIGH (ref 0.00–0.07)
Basophils Absolute: 0 10*3/uL (ref 0.0–0.1)
Basophils Relative: 0 %
Eosinophils Absolute: 0.1 10*3/uL (ref 0.0–0.5)
Eosinophils Relative: 0 %
HCT: 38.3 % (ref 36.0–46.0)
Hemoglobin: 12.7 g/dL (ref 12.0–15.0)
Immature Granulocytes: 4 %
Lymphocytes Relative: 7 %
Lymphs Abs: 1.4 10*3/uL (ref 0.7–4.0)
MCH: 29.3 pg (ref 26.0–34.0)
MCHC: 33.2 g/dL (ref 30.0–36.0)
MCV: 88.5 fL (ref 80.0–100.0)
Monocytes Absolute: 1.7 10*3/uL — ABNORMAL HIGH (ref 0.1–1.0)
Monocytes Relative: 8 %
Neutro Abs: 16.8 10*3/uL — ABNORMAL HIGH (ref 1.7–7.7)
Neutrophils Relative %: 81 %
Platelets: 176 10*3/uL (ref 150–400)
RBC: 4.33 MIL/uL (ref 3.87–5.11)
RDW: 14.1 % (ref 11.5–15.5)
WBC: 20.7 10*3/uL — ABNORMAL HIGH (ref 4.0–10.5)
nRBC: 0 % (ref 0.0–0.2)

## 2021-01-26 LAB — MAGNESIUM: Magnesium: 2 mg/dL (ref 1.7–2.4)

## 2021-01-26 LAB — COMPREHENSIVE METABOLIC PANEL
ALT: 21 U/L (ref 0–44)
AST: 19 U/L (ref 15–41)
Albumin: 2.5 g/dL — ABNORMAL LOW (ref 3.5–5.0)
Alkaline Phosphatase: 125 U/L (ref 38–126)
Anion gap: 7 (ref 5–15)
BUN: 8 mg/dL (ref 6–20)
CO2: 24 mmol/L (ref 22–32)
Calcium: 9 mg/dL (ref 8.9–10.3)
Chloride: 108 mmol/L (ref 98–111)
Creatinine, Ser: 1.4 mg/dL — ABNORMAL HIGH (ref 0.44–1.00)
GFR, Estimated: 50 mL/min — ABNORMAL LOW (ref >60)
Glucose, Bld: 103 mg/dL — ABNORMAL HIGH (ref 70–99)
Potassium: 3.6 mmol/L (ref 3.5–5.1)
Sodium: 139 mmol/L (ref 135–145)
Total Bilirubin: 1.3 mg/dL — ABNORMAL HIGH (ref 0.3–1.2)
Total Protein: 6.1 g/dL — ABNORMAL LOW (ref 6.5–8.1)

## 2021-01-26 LAB — HIV ANTIBODY (ROUTINE TESTING W REFLEX): HIV Screen 4th Generation wRfx: NONREACTIVE

## 2021-01-26 LAB — TSH: TSH: 1.966 u[IU]/mL (ref 0.350–4.500)

## 2021-01-26 MED ORDER — ACETAMINOPHEN 650 MG RE SUPP: 650.0000 mg | Freq: Four times a day (QID) | RECTAL | Status: DC | PRN

## 2021-01-26 MED ORDER — ENOXAPARIN SODIUM 60 MG/0.6ML IJ SOSY
55.0000 mg | PREFILLED_SYRINGE | INTRAMUSCULAR | Status: DC
  Administered 2021-01-26 – 2021-01-30 (×5): 55 mg via SUBCUTANEOUS
  Filled 2021-01-26 (×6): qty 0.6

## 2021-01-26 MED ORDER — GABAPENTIN 300 MG PO CAPS
300.0000 mg | ORAL_CAPSULE | Freq: Every day | ORAL | Status: DC
  Administered 2021-01-26 – 2021-01-27 (×2): 300 mg via ORAL
  Filled 2021-01-26 (×2): qty 1

## 2021-01-26 MED ORDER — MONTELUKAST SODIUM 10 MG PO TABS
10.0000 mg | ORAL_TABLET | Freq: Every day | ORAL | Status: DC
  Administered 2021-01-26 – 2021-01-27 (×2): 10 mg via ORAL
  Filled 2021-01-26 (×2): qty 1

## 2021-01-26 MED ORDER — SODIUM CHLORIDE 0.9 % IV SOLN
2.0000 g | INTRAVENOUS | Status: AC
  Administered 2021-01-26 – 2021-01-29 (×4): 2 g via INTRAVENOUS
  Filled 2021-01-26 (×4): qty 20

## 2021-01-26 MED ORDER — MIRTAZAPINE 15 MG PO TABS
30.0000 mg | ORAL_TABLET | Freq: Every day | ORAL | Status: DC
  Administered 2021-01-26 – 2021-01-27 (×2): 30 mg via ORAL
  Filled 2021-01-26 (×2): qty 2

## 2021-01-26 MED ORDER — HYDROMORPHONE HCL 1 MG/ML IJ SOLN
0.5000 mg | INTRAMUSCULAR | Status: DC | PRN
  Administered 2021-01-26 – 2021-01-27 (×6): 0.5 mg via INTRAVENOUS
  Filled 2021-01-26 (×6): qty 1

## 2021-01-26 MED ORDER — SODIUM CHLORIDE 0.9 % IV SOLN: INTRAVENOUS | Status: DC

## 2021-01-26 MED ORDER — LIDOCAINE 5 % EX PTCH
1.0000 | MEDICATED_PATCH | CUTANEOUS | Status: DC
  Administered 2021-01-26 – 2021-01-30 (×5): 1 via TRANSDERMAL
  Filled 2021-01-26 (×6): qty 1

## 2021-01-26 MED ORDER — ACETAMINOPHEN 325 MG PO TABS: 650.0000 mg | ORAL_TABLET | Freq: Four times a day (QID) | ORAL | Status: DC | PRN

## 2021-01-26 MED ORDER — DICLOFENAC SODIUM 1 % EX GEL
2.0000 g | Freq: Four times a day (QID) | CUTANEOUS | Status: DC
  Administered 2021-01-26 – 2021-01-30 (×11): 2 g via TOPICAL
  Filled 2021-01-26: qty 100

## 2021-01-26 MED ORDER — PAROXETINE HCL 20 MG PO TABS
40.0000 mg | ORAL_TABLET | Freq: Every day | ORAL | Status: DC
  Administered 2021-01-26 – 2021-01-28 (×3): 40 mg via ORAL
  Filled 2021-01-26 (×3): qty 2

## 2021-01-26 NOTE — H&P (Signed)
History and Physical    [facility] FIE:[id number] DOB: Apr 25, 1981 DOA: 01/25/2021  PCP: Karie Mainland, FNP  Patient coming from: Home.  Chief Complaint: Bilateral flank pain.  HPI: Cynthia Reese is a 40 y.o. female with with history of left shoulder pain status post surgery last year with history of asthma, depression has been experiencing bilateral flank pain for the last 48 hours which has been gradually worsening.  Denies any associated dysuria or any vaginal discharges.  Has been having multiple episodes of vomiting.  Given the symptoms patient presents to the ER.  ED Course: In the ER patient was tachycardic febrile with temperature of 100.1 F.  Labs are significant for leukocytosis of 26,000 potassium of 2.9 and creatinine 1.3.  UA is concerning for UTI with many bacteria large number of WBCs leukocyte esterase positive but squamous cells also positive.  CT abdomen pelvis done shows bilateral perinephric stranding concerning for pyelonephritis.  CT lumbar spine was not showing anything acute.  Blood cultures were obtained and started on empiric antibiotics for pyelonephritis and admitted for further management.  COVID test was negative chest x-ray unremarkable.  EKG shows sinus tachycardia.  Review of Systems: As per HPI, rest all negative.   Past Medical History:  Diagnosis Date  . Angio-edema   . Cervical cancer ([place])   . Urticaria     Past Surgical History:  Procedure Laterality Date  . breast cyst removal    . LEEP       reports that she has never smoked. She has never used smokeless tobacco. She reports that she does not drink alcohol and does not use drugs.  Allergies  Allergen Reactions  . Chocolate Hives  . Sulfa Antibiotics Hives  . Tramadol Hives    Family History  Problem Relation Age of Onset  . Urticaria Neg Hx   . Immunodeficiency Neg Hx   . Atopy Neg Hx   . Eczema Neg Hx   . Asthma Neg Hx   . Allergic rhinitis Neg Hx   . Angioedema Neg  Hx     Prior to Admission medications   Medication Sig Start Date End Date Taking? Authorizing Provider  cetirizine (ZYRTEC) 10 MG tablet 1 tablet once a day for runny nose, itchy eyes or itching. 07/20/19   Charlies Silvers, MD  diclofenac Sodium (VOLTAREN) 1 % GEL Apply 2 g topically 4 (four) times daily. 11/09/20   Garald Balding, PA-C  fluticasone (FLONASE) 50 MCG/ACT nasal spray Place 2 sprays into both nostrils daily as needed (for stuffy nose). 07/20/19   Charlies Silvers, MD  gabapentin (NEURONTIN) 300 MG capsule TAKE 1 CAPSULE BY MOUTH AT BEDTIME 05/14/19   [provider]  HYDROcodone-acetaminophen (NORCO/VICODIN) 5-325 MG tablet Take 1 tablet by mouth every 6 (six) hours as needed. 03/04/19   Ward, Delice Bison, DO  ibuprofen (ADVIL) 800 MG tablet Take 1 tablet (800 mg total) by mouth 3 (three) times daily. 11/09/20   Garald Balding, PA-C  lidocaine (LIDODERM) 5 % Place 1 patch onto the skin daily. Remove & Discard patch within 12 hours or as directed by MD 11/09/20   Garald Balding, PA-C  meloxicam (MOBIC) 15 MG tablet TAKE 1 TABLET BY MOUTH EVERY DAY 05/14/19   [provider]  mirtazapine (REMERON) 30 MG tablet Take by mouth. 02/26/17   [provider]  montelukast (SINGULAIR) 10 MG tablet 1 tablet once a day to prevent coughing or wheezing. 07/20/19   Prince Solian  A, MD  PARoxetine (PAXIL) 40 MG tablet Take by mouth. 06/07/19   [provider]    Physical Exam: Constitutional: Moderately built and nourished. Vitals:   01/25/21 2130 01/26/21 0000 01/26/21 0100 01/26/21 0300  BP: 122/71 112/76 112/76 (!) 117/58  Pulse: (!) 104 93 95 (!) 114  Resp: 20 18 18 20   Temp: 98.9 F (37.2 C)  98.9 F (37.2 C) 98.8 F (37.1 C)  TempSrc: Oral  Oral Oral  SpO2: 99% 99% 99% 100%  Weight:      Height:       Eyes: Anicteric no pallor. ENMT: No discharge from the ears eyes nose or mouth. Neck: No mass felt.  No neck rigidity. Respiratory: No rhonchi or  crepitations. Cardiovascular: S1-S2 heard. Abdomen: Soft.  Bilateral flank tenderness.  No guarding or rigidity. Musculoskeletal: Bilateral flank tenderness. Skin: No rash. Neurologic: Alert awake oriented to time place and person.  Moves all extremities. Psychiatric: Appears normal.  Normal affect.   Labs on Admission: I have personally reviewed following labs and imaging studies  CBC: Recent Labs  Lab 01/25/21 1909  WBC 26.1*  NEUTROABS 20.2*  HGB 13.2  HCT 38.6  MCV 86.9  PLT 123456   Basic Metabolic Panel: Recent Labs  Lab 01/25/21 1909  NA 135  K 2.9*  CL 101  CO2 23  GLUCOSE 114*  BUN 13  CREATININE 1.38*  CALCIUM 9.3   GFR: Estimated Creatinine Clearance: 65.7 mL/min (A) (by C-G formula based on SCr of 1.38 mg/dL (H)). Liver Function Tests: Recent Labs  Lab 01/25/21 1909  AST 22  ALT 25  ALKPHOS 133*  BILITOT 1.3*  PROT 7.6  ALBUMIN 3.1*   No results for input(s): LIPASE, AMYLASE in the last 168 hours. No results for input(s): AMMONIA in the last 168 hours. Coagulation Profile: No results for input(s): INR, PROTIME in the last 168 hours. Cardiac Enzymes: No results for input(s): CKTOTAL, CKMB, CKMBINDEX, TROPONINI in the last 168 hours. BNP (last 3 results) No results for input(s): PROBNP in the last 8760 hours. HbA1C: No results for input(s): HGBA1C in the last 72 hours. CBG: No results for input(s): GLUCAP in the last 168 hours. Lipid Profile: No results for input(s): CHOL, HDL, LDLCALC, TRIG, CHOLHDL, LDLDIRECT in the last 72 hours. Thyroid Function Tests: No results for input(s): TSH, T4TOTAL, FREET4, T3FREE, THYROIDAB in the last 72 hours. Anemia Panel: No results for input(s): VITAMINB12, FOLATE, FERRITIN, TIBC, IRON, RETICCTPCT in the last 72 hours. Urine analysis:    Component Value Date/Time   COLORURINE YELLOW 01/25/2021 1909   APPEARANCEUR CLOUDY (A) 01/25/2021 1909   LABSPEC <1.005 (L) 01/25/2021 1909   PHURINE 6.5 01/25/2021  1909   GLUCOSEU NEGATIVE 01/25/2021 1909   HGBUR LARGE (A) 01/25/2021 1909   BILIRUBINUR NEGATIVE 01/25/2021 [place] NEGATIVE 01/25/2021 1909   PROTEINUR 30 (A) 01/25/2021 1909   NITRITE NEGATIVE 01/25/2021 1909   LEUKOCYTESUR LARGE (A) 01/25/2021 1909   Sepsis Labs: @LABRCNTIP (procalcitonin:4,lacticidven:4) ) Recent Results (from the past 240 hour(s))  Resp Panel by RT-PCR (Flu A&B, Covid) Nasopharyngeal Swab     Status: None   Collection Time: 01/25/21  7:09 PM   Specimen: Nasopharyngeal Swab; Nasopharyngeal(NP) swabs in vial transport medium  Result Value Ref Range Status   SARS Coronavirus 2 by RT PCR NEGATIVE NEGATIVE Final    Comment: (NOTE) SARS-CoV-2 target nucleic acids are NOT DETECTED.  The SARS-CoV-2 RNA is generally detectable in upper respiratory specimens during the acute phase of  infection. The lowest concentration of SARS-CoV-2 viral copies this assay can detect is 138 copies/mL. A negative result does not preclude SARS-Cov-2 infection and should not be used as the sole basis for treatment or other patient management decisions. A negative result may occur with  improper specimen collection/handling, submission of specimen other than nasopharyngeal swab, presence of viral mutation(s) within the areas targeted by this assay, and inadequate number of viral copies(<138 copies/mL). A negative result must be combined with clinical observations, patient history, and epidemiological information. The expected result is Negative.  Fact Sheet for Patients:  EntrepreneurPulse.com.au  Fact Sheet for Healthcare Providers:  IncredibleEmployment.be  This test is no t yet approved or cleared by the [place] FDA and  has been authorized for detection and/or diagnosis of SARS-CoV-2 by FDA under an Emergency Use Authorization (EUA). This EUA will remain  in effect (meaning this test can be used) for the duration of the COVID-19  declaration under Section 564(b)(1) of the Act, 21 U.S.C.section 360bbb-3(b)(1), unless the authorization is terminated  or revoked sooner.       Influenza A by PCR NEGATIVE NEGATIVE Final   Influenza B by PCR NEGATIVE NEGATIVE Final    Comment: (NOTE) The Xpert Xpress SARS-CoV-2/FLU/RSV plus assay is intended as an aid in the diagnosis of influenza from Nasopharyngeal swab specimens and should not be used as a sole basis for treatment. Nasal washings and aspirates are unacceptable for Xpert Xpress SARS-CoV-2/FLU/RSV testing.  Fact Sheet for Patients: EntrepreneurPulse.com.au  Fact Sheet for Healthcare Providers: IncredibleEmployment.be  This test is not yet approved or cleared by the [place] FDA and has been authorized for detection and/or diagnosis of SARS-CoV-2 by FDA under an Emergency Use Authorization (EUA). This EUA will remain in effect (meaning this test can be used) for the duration of the COVID-19 declaration under Section 564(b)(1) of the Act, 21 U.S.C. section 360bbb-3(b)(1), unless the authorization is terminated or revoked.  Performed at [facility], [address] [place], [place] [address]      Radiological Exams on Admission: CT ABDOMEN PELVIS WO CONTRAST  Result Date: 01/25/2021 CLINICAL DATA:  Low back pain, no known injury, initial encounter EXAM: CT ABDOMEN AND PELVIS WITHOUT CONTRAST TECHNIQUE: Multidetector CT imaging of the abdomen and pelvis was performed following the standard protocol without IV contrast. COMPARISON:  None. FINDINGS: Lower chest: Mild left basilar atelectasis is noted. Hepatobiliary: No focal liver abnormality is seen. No gallstones, gallbladder wall thickening, or biliary dilatation. Pancreas: Unremarkable. No pancreatic ductal dilatation or surrounding inflammatory changes. Spleen: Normal in size without focal abnormality. Adrenals/Urinary Tract: Adrenal glands are within normal  limits. Kidneys are well visualized bilaterally. No renal calculi are noted. Mild perinephric stranding is seen. The ureters demonstrate some mild surrounding inflammatory change as well. No obstructive changes are seen. The bladder is partially distended. Stomach/Bowel: Appendix is within normal limits. No obstructive or inflammatory changes of the colon are seen. Small bowel and stomach are within normal limits. Vascular/Lymphatic: Aortic atherosclerosis. No enlarged abdominal or pelvic lymph nodes. Reproductive: Uterus and bilateral adnexa are unremarkable. Other: Minimal free fluid likely physiologic in nature within the pelvic cul-de-sac. Musculoskeletal: No acute or significant osseous findings. IMPRESSION: Mild perinephric stranding and Periureteral stranding without definitive obstructive change. Possibility of underlying pyelonephritis would deserve consideration. Correlation with laboratory values is recommended. Normal-appearing appendix. Mild left basilar atelectasis. Electronically Signed   By: Inez Catalina M.D.   On: 01/25/2021 21:02   CT L-SPINE NO CHARGE  Result  Date: 01/25/2021 CLINICAL DATA:  Initial evaluation for back pain with extension into the lower extremities. EXAM: CT LUMBAR SPINE WITHOUT CONTRAST TECHNIQUE: Multidetector CT imaging of the lumbar spine was performed without intravenous contrast administration. Multiplanar CT image reconstructions were also generated. COMPARISON:  Prior radiograph from 03/04/2019 FINDINGS: Segmentation: Standard. Lowest well-formed disc space labeled the L5-S1 level. Alignment: Physiologic with preservation of the normal lumbar lordosis. No listhesis. Vertebrae: Vertebral body height maintained without acute or chronic fracture. Visualized sacrum and pelvis intact. SI joints symmetric and within normal limits. No discrete or worrisome osseous lesions. Paraspinal and other soft tissues: Paraspinous soft tissues demonstrate no acute finding. Mild aorto  bi-iliac atherosclerotic disease. Duplicated IVC noted. Disc levels: L1-2:  Unremarkable. L2-3:  Unremarkable. L3-4: Negative interspace. Minimal facet hypertrophy. No canal or foraminal stenosis. L4-5: Minimal disc bulge with facet hypertrophy. No spinal stenosis. Foramina remain patent. L5-S1: Shallow broad-based central disc protrusion (series 4, image 105). Protruding disc closely approximates both of the descending S1 nerve roots without frank neural impingement or displacement. Mild facet hypertrophy. No significant spinal stenosis. Foramina remain patent. IMPRESSION: 1. No acute abnormality within the lumbar spine. 2. Shallow broad-based central disc protrusion at L5-S1, closely approximating both of the descending S1 nerve roots without frank neural impingement or displacement. 3. No other significant stenosis or evidence for neural impingement within the lumbar spine. 4. Aortic Atherosclerosis (ICD10-I70.0). Electronically Signed   By: Jeannine Boga M.D.   On: 01/25/2021 21:22   DG Chest Port 1 View  Result Date: 01/25/2021 CLINICAL DATA:  Questionable sepsis - evaluate for abnormality Patient reports back pain radiating down both legs. EXAM: PORTABLE CHEST 1 VIEW COMPARISON:  Radiograph 03/18/2018 FINDINGS: Overlying monitoring devices partially obscure evaluation. Lung volumes are low. Mild bibasilar atelectasis. No confluent consolidation. Normal heart size and mediastinal contours. No pleural fluid or pneumothorax. No pulmonary edema. No acute osseous abnormalities are seen. IMPRESSION: Low lung volumes with mild bibasilar atelectasis. Electronically Signed   By: Keith Rake M.D.   On: 01/25/2021 20:18    EKG: Independently reviewed.  Normal sinus rhythm.  Assessment/Plan Principal Problem:   Sepsis without acute organ dysfunction (HCC) Active Problems:   Acute pyelonephritis   Hypokalemia   Sepsis ([place])    1. Possible developing sepsis from pyelonephritis for which patient  is started on ceftriaxone continue with hydration pain medications and follow cultures.  Since patient is quite tachycardic I am checking TSH. 2. Hypokalemia likely from nausea vomiting.  Potassium was replaced will recheck metabolic panel check magnesium. 3. History of depression on Paxil. 4. 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 5. History of asthma on Singulair.  Not actively wheezing.   DVT prophylaxis: Lovenox. Code Status: Full code. Family Communication: Discussed with patient. Disposition Plan: Home. Consults called: None. Admission status: Inpatient.   Rise Patience MD [facility] Pager [phone].  If 7PM-7AM, please contact night-coverage [email or web address] Password [facility]  01/26/2021, 4:04 AM

## 2021-01-26 NOTE — Progress Notes (Signed)
PROGRESS NOTE  Dorette Hartel IRC:[id number] DOB: 1980/11/24 DOA: 01/25/2021 PCP: Karie Mainland, FNP  HPI/Recap of past 24 hours: HPI from Dr Patrick Jupiter is a 40 y.o. female with history of left shoulder pain status post surgery last year, history of asthma, depression has been experiencing bilateral flank pain for the last 48 hours gradually worsened, associated with multiple episodes of nausea/vomiting, denies any associated dysuria or any vaginal discharges. In the ED, pt was tachycardic, febrile with temperature of 100.3 F.  Labs significant for leukocytosis of 26,000, potassium of 2.9 and creatinine 1.3.  UA is concerning for UTI with many bacteria large number of WBCs leukocyte esterase positive but squamous cells also positive.  CT abdomen pelvis done shows bilateral perinephric stranding concerning for pyelonephritis.  CT lumbar spine was not showing anything acute. COVID test was negative chest x-ray unremarkable. EKG shows sinus tachycardia. Blood cultures were obtained and started on empiric antibiotics for pyelonephritis and admitted for further management.      Today, pt still with back pain, bilateral flank pain, denies nausea or any further vomiting, denies any chest pain, SOB. Does report palpitation.    Assessment/Plan: Principal Problem:   Sepsis without acute organ dysfunction (HCC) Active Problems:   Acute pyelonephritis   Hypokalemia   Sepsis ([place])   Sepsis likely 2/2 acute pyelonephritis On admission, pt was tachycardic, leukocytosis Currently afebrile, with leukocytosis UA showed large leukocytes, many bacteria, >50 WBC UC pending BC X 2 pending CT abd/pelvis showed bilateral perinephric stranding concerning for pyelonephritis IV Ceftriaxone IVF, pain management Monitor closely  ?AKI Likely 2/2 above Continue IV fluids Daily BMP  Hypokalemia Replace as needed  History of asthma Stable Use Singulair, inhalers/DuoNebs as  needed  Chronic left shoulder pain Continue PTA pain regimen  History of depression Continue Paxil, remeron  Morbid obesity Lifestyle modification advised Estimated body mass index is 42.16 kg/m as calculated from the following:   Height as of this encounter: 5\' 3"  (1.6 m).   Weight as of this encounter: 108 kg.      Code Status: Full  Family Communication: None at bedside  Disposition Plan: Status is: Observation  The patient will require care spanning > 2 midnights and should be moved to inpatient because: Inpatient level of care appropriate due to severity of illness  Dispo: The patient is from: Home              Anticipated d/c is to: Home              Patient currently is not medically stable to d/c.   Difficult to place patient No   Consultants:  None  Procedures: None  Antimicrobials:  Ceftriaxone  DVT prophylaxis:  lovenox   Objective: Vitals:   01/26/21 0100 01/26/21 0300 01/26/21 0600 01/26/21 1043  BP: 112/76 (!) 117/58 119/80 131/82  Pulse: 95 (!) 114 99 97  Resp: 18 20 20 17   Temp: 98.9 F (37.2 C) 98.8 F (37.1 C) 97.6 F (36.4 C) 100 F (37.8 C)  TempSrc: Oral Oral Oral Oral  SpO2: 99% 100% 100% 100%  Weight:      Height:        Intake/Output Summary (Last 24 hours) at 01/26/2021 1122 Last data filed at 01/26/2021 0837 Gross per 24 hour  Intake 1979.82 ml  Output --  Net 1979.82 ml   Filed Weights   01/25/21 1741  Weight: 108 kg    Exam:  General: NAD   Cardiovascular: S1, S2 present  Respiratory: CTAB  Abdomen: Soft, bilateral flank tenderness, obese, bowel sounds present  Musculoskeletal: No bilateral pedal edema noted  Skin: Normal, multiple tattoos noted   Psychiatry: Normal mood   Data Reviewed: CBC: Recent Labs  Lab 01/25/21 1909 01/26/21 0630  WBC 26.1* 20.7*  NEUTROABS 20.2* 16.8*  HGB 13.2 12.7  HCT 38.6 38.3  MCV 86.9 88.5  PLT 230 782   Basic Metabolic Panel: Recent Labs  Lab  01/25/21 1909 01/26/21 0630  NA 135 139  K 2.9* 3.6  CL 101 108  CO2 23 24  GLUCOSE 114* 103*  BUN 13 8  CREATININE 1.38* 1.40*  CALCIUM 9.3 9.0  MG  --  2.0   GFR: Estimated Creatinine Clearance: 64.8 mL/min (A) (by C-G formula based on SCr of 1.4 mg/dL (H)). Liver Function Tests: Recent Labs  Lab 01/25/21 1909 01/26/21 0630  AST 22 19  ALT 25 21  ALKPHOS 133* 125  BILITOT 1.3* 1.3*  PROT 7.6 6.1*  ALBUMIN 3.1* 2.5*   No results for input(s): LIPASE, AMYLASE in the last 168 hours. No results for input(s): AMMONIA in the last 168 hours. Coagulation Profile: No results for input(s): INR, PROTIME in the last 168 hours. Cardiac Enzymes: No results for input(s): CKTOTAL, CKMB, CKMBINDEX, TROPONINI in the last 168 hours. BNP (last 3 results) No results for input(s): PROBNP in the last 8760 hours. HbA1C: No results for input(s): HGBA1C in the last 72 hours. CBG: No results for input(s): GLUCAP in the last 168 hours. Lipid Profile: No results for input(s): CHOL, HDL, LDLCALC, TRIG, CHOLHDL, LDLDIRECT in the last 72 hours. Thyroid Function Tests: Recent Labs    01/26/21 0630  TSH 1.966   Anemia Panel: No results for input(s): VITAMINB12, FOLATE, FERRITIN, TIBC, IRON, RETICCTPCT in the last 72 hours. Urine analysis:    Component Value Date/Time   COLORURINE YELLOW 01/25/2021 1909   APPEARANCEUR CLOUDY (A) 01/25/2021 1909   LABSPEC <1.005 (L) 01/25/2021 1909   PHURINE 6.5 01/25/2021 1909   GLUCOSEU NEGATIVE 01/25/2021 1909   HGBUR LARGE (A) 01/25/2021 [place] NEGATIVE 01/25/2021 [place] NEGATIVE 01/25/2021 1909   PROTEINUR 30 (A) 01/25/2021 1909   NITRITE NEGATIVE 01/25/2021 1909   LEUKOCYTESUR LARGE (A) 01/25/2021 1909   Sepsis Labs: @LABRCNTIP (procalcitonin:4,lacticidven:4)  ) Recent Results (from the past 240 hour(s))  Resp Panel by RT-PCR (Flu A&B, Covid) Nasopharyngeal Swab     Status: None   Collection Time: 01/25/21  7:09 PM    Specimen: Nasopharyngeal Swab; Nasopharyngeal(NP) swabs in vial transport medium  Result Value Ref Range Status   SARS Coronavirus 2 by RT PCR NEGATIVE NEGATIVE Final    Comment: (NOTE) SARS-CoV-2 target nucleic acids are NOT DETECTED.  The SARS-CoV-2 RNA is generally detectable in upper respiratory specimens during the acute phase of infection. The lowest concentration of SARS-CoV-2 viral copies this assay can detect is 138 copies/mL. A negative result does not preclude SARS-Cov-2 infection and should not be used as the sole basis for treatment or other patient management decisions. A negative result may occur with  improper specimen collection/handling, submission of specimen other than nasopharyngeal swab, presence of viral mutation(s) within the areas targeted by this assay, and inadequate number of viral copies(<138 copies/mL). A negative result must be combined with clinical observations, patient history, and epidemiological information. The expected result is Negative.  Fact Sheet for Patients:  EntrepreneurPulse.com.au  Fact Sheet for Healthcare Providers:  IncredibleEmployment.be  This test is no t yet approved or  cleared by the [place] and  has been authorized for detection and/or diagnosis of SARS-CoV-2 by FDA under an Emergency Use Authorization (EUA). This EUA will remain  in effect (meaning this test can be used) for the duration of the COVID-19 declaration under Section 564(b)(1) of the Act, 21 U.S.C.section 360bbb-3(b)(1), unless the authorization is terminated  or revoked sooner.       Influenza A by PCR NEGATIVE NEGATIVE Final   Influenza B by PCR NEGATIVE NEGATIVE Final    Comment: (NOTE) The Xpert Xpress SARS-CoV-2/FLU/RSV plus assay is intended as an aid in the diagnosis of influenza from Nasopharyngeal swab specimens and should not be used as a sole basis for treatment. Nasal washings and aspirates are  unacceptable for Xpert Xpress SARS-CoV-2/FLU/RSV testing.  Fact Sheet for Patients: EntrepreneurPulse.com.au  Fact Sheet for Healthcare Providers: IncredibleEmployment.be  This test is not yet approved or cleared by the [place] FDA and has been authorized for detection and/or diagnosis of SARS-CoV-2 by FDA under an Emergency Use Authorization (EUA). This EUA will remain in effect (meaning this test can be used) for the duration of the COVID-19 declaration under Section 564(b)(1) of the Act, 21 U.S.C. section 360bbb-3(b)(1), unless the authorization is terminated or revoked.  Performed at [facility], [address] [place], [place] [address]       Studies: CT ABDOMEN PELVIS WO CONTRAST  Result Date: 01/25/2021 CLINICAL DATA:  Low back pain, no known injury, initial encounter EXAM: CT ABDOMEN AND PELVIS WITHOUT CONTRAST TECHNIQUE: Multidetector CT imaging of the abdomen and pelvis was performed following the standard protocol without IV contrast. COMPARISON:  None. FINDINGS: Lower chest: Mild left basilar atelectasis is noted. Hepatobiliary: No focal liver abnormality is seen. No gallstones, gallbladder wall thickening, or biliary dilatation. Pancreas: Unremarkable. No pancreatic ductal dilatation or surrounding inflammatory changes. Spleen: Normal in size without focal abnormality. Adrenals/Urinary Tract: Adrenal glands are within normal limits. Kidneys are well visualized bilaterally. No renal calculi are noted. Mild perinephric stranding is seen. The ureters demonstrate some mild surrounding inflammatory change as well. No obstructive changes are seen. The bladder is partially distended. Stomach/Bowel: Appendix is within normal limits. No obstructive or inflammatory changes of the colon are seen. Small bowel and stomach are within normal limits. Vascular/Lymphatic: Aortic atherosclerosis. No enlarged abdominal or pelvic lymph nodes.  Reproductive: Uterus and bilateral adnexa are unremarkable. Other: Minimal free fluid likely physiologic in nature within the pelvic cul-de-sac. Musculoskeletal: No acute or significant osseous findings. IMPRESSION: Mild perinephric stranding and Periureteral stranding without definitive obstructive change. Possibility of underlying pyelonephritis would deserve consideration. Correlation with laboratory values is recommended. Normal-appearing appendix. Mild left basilar atelectasis. Electronically Signed   By: Inez Catalina M.D.   On: 01/25/2021 21:02   CT L-SPINE NO CHARGE  Result Date: 01/25/2021 CLINICAL DATA:  Initial evaluation for back pain with extension into the lower extremities. EXAM: CT LUMBAR SPINE WITHOUT CONTRAST TECHNIQUE: Multidetector CT imaging of the lumbar spine was performed without intravenous contrast administration. Multiplanar CT image reconstructions were also generated. COMPARISON:  Prior radiograph from 03/04/2019 FINDINGS: Segmentation: Standard. Lowest well-formed disc space labeled the L5-S1 level. Alignment: Physiologic with preservation of the normal lumbar lordosis. No listhesis. Vertebrae: Vertebral body height maintained without acute or chronic fracture. Visualized sacrum and pelvis intact. SI joints symmetric and within normal limits. No discrete or worrisome osseous lesions. Paraspinal and other soft tissues: Paraspinous soft tissues demonstrate no acute finding. Mild aorto bi-iliac atherosclerotic disease. Duplicated IVC noted.  Disc levels: L1-2:  Unremarkable. L2-3:  Unremarkable. L3-4: Negative interspace. Minimal facet hypertrophy. No canal or foraminal stenosis. L4-5: Minimal disc bulge with facet hypertrophy. No spinal stenosis. Foramina remain patent. L5-S1: Shallow broad-based central disc protrusion (series 4, image 105). Protruding disc closely approximates both of the descending S1 nerve roots without frank neural impingement or displacement. Mild facet  hypertrophy. No significant spinal stenosis. Foramina remain patent. IMPRESSION: 1. No acute abnormality within the lumbar spine. 2. Shallow broad-based central disc protrusion at L5-S1, closely approximating both of the descending S1 nerve roots without frank neural impingement or displacement. 3. No other significant stenosis or evidence for neural impingement within the lumbar spine. 4. Aortic Atherosclerosis (ICD10-I70.0). Electronically Signed   By: Jeannine Boga M.D.   On: 01/25/2021 21:22   DG Chest Port 1 View  Result Date: 01/25/2021 CLINICAL DATA:  Questionable sepsis - evaluate for abnormality Patient reports back pain radiating down both legs. EXAM: PORTABLE CHEST 1 VIEW COMPARISON:  Radiograph 03/18/2018 FINDINGS: Overlying monitoring devices partially obscure evaluation. Lung volumes are low. Mild bibasilar atelectasis. No confluent consolidation. Normal heart size and mediastinal contours. No pleural fluid or pneumothorax. No pulmonary edema. No acute osseous abnormalities are seen. IMPRESSION: Low lung volumes with mild bibasilar atelectasis. Electronically Signed   By: Keith Rake M.D.   On: 01/25/2021 20:18    Scheduled Meds: . diclofenac Sodium  2 g Topical QID  . enoxaparin (LOVENOX) injection  55 mg Subcutaneous Q24H  . gabapentin  300 mg Oral QHS  . lidocaine  1 patch Transdermal Q24H  . mirtazapine  30 mg Oral QHS  . montelukast  10 mg Oral QHS  . PARoxetine  40 mg Oral Daily    Continuous Infusions: . sodium chloride 125 mL/hr at 01/26/21 0454  . cefTRIAXone (ROCEPHIN)  IV       LOS: 0 days     Alma Friendly, MD [facility]  If 7PM-7AM, please contact night-coverage [email or web address] 01/26/2021, 11:22 AM

## 2021-01-26 NOTE — Progress Notes (Addendum)
PHARMACY - PHYSICIAN COMMUNICATION CRITICAL VALUE ALERT - BLOOD CULTURE IDENTIFICATION (BCID)  Cynthia Reese is an 40 y.o. female who presented to [facility] on 01/25/2021 with a chief complaint of flank pain, suspected pyelonephritis  Assessment: GNR 2/4 (aerobic bottles), BCID Ecoli  Name of physician (or Provider) Contacted: Dr. Horris Latino  Current antibiotics: Ceftriaxone 2g q24h  Changes to prescribed antibiotics recommended: Continue Ceftriaxone, already on preferred regimen for Ecoli bacteremia  Results for orders placed or performed during the hospital encounter of 01/25/21  Blood Culture ID Panel (Reflexed) (Collected: 01/25/2021  9:01 PM)  Result Value Ref Range   Enterococcus faecalis NOT DETECTED NOT DETECTED   Enterococcus Faecium NOT DETECTED NOT DETECTED   Listeria monocytogenes NOT DETECTED NOT DETECTED   Staphylococcus species NOT DETECTED NOT DETECTED   Staphylococcus aureus (BCID) NOT DETECTED NOT DETECTED   Staphylococcus epidermidis NOT DETECTED NOT DETECTED   Staphylococcus lugdunensis NOT DETECTED NOT DETECTED   Streptococcus species NOT DETECTED NOT DETECTED   Streptococcus agalactiae NOT DETECTED NOT DETECTED   Streptococcus pneumoniae NOT DETECTED NOT DETECTED   Streptococcus pyogenes NOT DETECTED NOT DETECTED   A.calcoaceticus-baumannii NOT DETECTED NOT DETECTED   Bacteroides fragilis NOT DETECTED NOT DETECTED   Enterobacterales DETECTED (A) NOT DETECTED   Enterobacter cloacae complex NOT DETECTED NOT DETECTED   Escherichia coli DETECTED (A) NOT DETECTED   Klebsiella aerogenes NOT DETECTED NOT DETECTED   Klebsiella oxytoca NOT DETECTED NOT DETECTED   Klebsiella pneumoniae NOT DETECTED NOT DETECTED   Proteus species NOT DETECTED NOT DETECTED   Salmonella species NOT DETECTED NOT DETECTED   Serratia marcescens NOT DETECTED NOT DETECTED   Haemophilus influenzae NOT DETECTED NOT DETECTED   Neisseria meningitidis NOT DETECTED NOT DETECTED   Pseudomonas  aeruginosa NOT DETECTED NOT DETECTED   Stenotrophomonas maltophilia NOT DETECTED NOT DETECTED   Candida albicans NOT DETECTED NOT DETECTED   Candida auris NOT DETECTED NOT DETECTED   Candida glabrata NOT DETECTED NOT DETECTED   Candida krusei NOT DETECTED NOT DETECTED   Candida parapsilosis NOT DETECTED NOT DETECTED   Candida tropicalis NOT DETECTED NOT DETECTED   Cryptococcus neoformans/gattii NOT DETECTED NOT DETECTED   CTX-M ESBL NOT DETECTED NOT DETECTED   Carbapenem resistance IMP NOT DETECTED NOT DETECTED   Carbapenem resistance KPC NOT DETECTED NOT DETECTED   Carbapenem resistance NDM NOT DETECTED NOT DETECTED   Carbapenem resist OXA 48 LIKE NOT DETECTED NOT DETECTED   Carbapenem resistance VIM NOT DETECTED NOT [place], PharmD PGY-1 Pharmacy Resident 01/26/2021 2:18 PM Please see AMION for all pharmacy numbers

## 2021-01-26 NOTE — Plan of Care (Signed)
  Problem: Education: Goal: Knowledge of General Education information will improve Description Including pain rating scale, medication(s)/side effects and non-pharmacologic comfort measures Outcome: Progressing   

## 2021-01-27 DIAGNOSIS — N179 Acute kidney failure, unspecified: Secondary | ICD-10-CM | POA: Diagnosis present

## 2021-01-27 DIAGNOSIS — G8929 Other chronic pain: Secondary | ICD-10-CM | POA: Diagnosis present

## 2021-01-27 DIAGNOSIS — E876 Hypokalemia: Secondary | ICD-10-CM | POA: Diagnosis not present

## 2021-01-27 DIAGNOSIS — F32A Depression, unspecified: Secondary | ICD-10-CM | POA: Diagnosis present

## 2021-01-27 DIAGNOSIS — Z8541 Personal history of malignant neoplasm of cervix uteri: Secondary | ICD-10-CM | POA: Diagnosis not present

## 2021-01-27 DIAGNOSIS — A4151 Sepsis due to Escherichia coli [E. coli]: Secondary | ICD-10-CM | POA: Diagnosis not present

## 2021-01-27 DIAGNOSIS — Z6841 Body Mass Index (BMI) 40.0 and over, adult: Secondary | ICD-10-CM | POA: Diagnosis not present

## 2021-01-27 DIAGNOSIS — R350 Frequency of micturition: Secondary | ICD-10-CM | POA: Diagnosis present

## 2021-01-27 DIAGNOSIS — R262 Difficulty in walking, not elsewhere classified: Secondary | ICD-10-CM | POA: Diagnosis present

## 2021-01-27 DIAGNOSIS — Z20822 Contact with and (suspected) exposure to covid-19: Secondary | ICD-10-CM | POA: Diagnosis present

## 2021-01-27 DIAGNOSIS — Z882 Allergy status to sulfonamides status: Secondary | ICD-10-CM | POA: Diagnosis not present

## 2021-01-27 DIAGNOSIS — N10 Acute tubulo-interstitial nephritis: Secondary | ICD-10-CM | POA: Diagnosis not present

## 2021-01-27 DIAGNOSIS — Z885 Allergy status to narcotic agent status: Secondary | ICD-10-CM | POA: Diagnosis not present

## 2021-01-27 DIAGNOSIS — Z79899 Other long term (current) drug therapy: Secondary | ICD-10-CM | POA: Diagnosis not present

## 2021-01-27 DIAGNOSIS — J45909 Unspecified asthma, uncomplicated: Secondary | ICD-10-CM | POA: Diagnosis present

## 2021-01-27 DIAGNOSIS — Z791 Long term (current) use of non-steroidal anti-inflammatories (NSAID): Secondary | ICD-10-CM | POA: Diagnosis not present

## 2021-01-27 DIAGNOSIS — M25512 Pain in left shoulder: Secondary | ICD-10-CM | POA: Diagnosis present

## 2021-01-27 LAB — CBC WITH DIFFERENTIAL/PLATELET
Abs Immature Granulocytes: 0.33 10*3/uL — ABNORMAL HIGH (ref 0.00–0.07)
Basophils Absolute: 0.1 10*3/uL (ref 0.0–0.1)
Basophils Relative: 0 %
Eosinophils Absolute: 0.2 10*3/uL (ref 0.0–0.5)
Eosinophils Relative: 1 %
HCT: 39 % (ref 36.0–46.0)
Hemoglobin: 12.8 g/dL (ref 12.0–15.0)
Immature Granulocytes: 2 %
Lymphocytes Relative: 11 %
Lymphs Abs: 2 10*3/uL (ref 0.7–4.0)
MCH: 28.9 pg (ref 26.0–34.0)
MCHC: 32.8 g/dL (ref 30.0–36.0)
MCV: 88 fL (ref 80.0–100.0)
Monocytes Absolute: 1.7 10*3/uL — ABNORMAL HIGH (ref 0.1–1.0)
Monocytes Relative: 9 %
Neutro Abs: 14.6 10*3/uL — ABNORMAL HIGH (ref 1.7–7.7)
Neutrophils Relative %: 77 %
Platelets: 189 10*3/uL (ref 150–400)
RBC: 4.43 MIL/uL (ref 3.87–5.11)
RDW: 14.5 % (ref 11.5–15.5)
WBC: 19 10*3/uL — ABNORMAL HIGH (ref 4.0–10.5)
nRBC: 0 % (ref 0.0–0.2)

## 2021-01-27 LAB — URINE CULTURE

## 2021-01-27 LAB — COMPREHENSIVE METABOLIC PANEL
ALT: 19 U/L (ref 0–44)
AST: 17 U/L (ref 15–41)
Albumin: 2.2 g/dL — ABNORMAL LOW (ref 3.5–5.0)
Alkaline Phosphatase: 132 U/L — ABNORMAL HIGH (ref 38–126)
Anion gap: 8 (ref 5–15)
BUN: 7 mg/dL (ref 6–20)
CO2: 22 mmol/L (ref 22–32)
Calcium: 9 mg/dL (ref 8.9–10.3)
Chloride: 108 mmol/L (ref 98–111)
Creatinine, Ser: 1.39 mg/dL — ABNORMAL HIGH (ref 0.44–1.00)
GFR, Estimated: 50 mL/min — ABNORMAL LOW (ref >60)
Glucose, Bld: 80 mg/dL (ref 70–99)
Potassium: 3.6 mmol/L (ref 3.5–5.1)
Sodium: 138 mmol/L (ref 135–145)
Total Bilirubin: 1.2 mg/dL (ref 0.3–1.2)
Total Protein: 6.2 g/dL — ABNORMAL LOW (ref 6.5–8.1)

## 2021-01-27 MED ORDER — SODIUM CHLORIDE 0.9 % IV SOLN: INTRAVENOUS | Status: DC

## 2021-01-27 NOTE — Plan of Care (Signed)
  Problem: Health Behavior/Discharge Planning: Goal: Ability to manage health-related needs will improve Outcome: Progressing   Problem: Nutrition: Goal: Adequate nutrition will be maintained Outcome: Progressing   

## 2021-01-27 NOTE — Plan of Care (Signed)
  Problem: Education: Goal: Knowledge of General Education information will improve Description: Including pain rating scale, medication(s)/side effects and non-pharmacologic comfort measures Outcome: Progressing   Problem: Clinical Measurements: Goal: Ability to maintain clinical measurements within normal limits will improve Outcome: Progressing   Problem: Clinical Measurements: Goal: Will remain free from infection Outcome: Progressing   Problem: Clinical Measurements: Goal: Diagnostic test results will improve Outcome: Progressing   Problem: Activity: Goal: Risk for activity intolerance will decrease Outcome: Progressing   Problem: Nutrition: Goal: Adequate nutrition will be maintained Outcome: Progressing   Problem: Coping: Goal: Level of anxiety will decrease Outcome: Progressing   Problem: Pain Managment: Goal: General experience of comfort will improve Outcome: Progressing   Problem: Safety: Goal: Ability to remain free from injury will improve Outcome: Progressing

## 2021-01-27 NOTE — Progress Notes (Signed)
PROGRESS NOTE  Cynthia Reese UVO:[id number] DOB: 1981/03/12 DOA: 01/25/2021 PCP: Karie Mainland, FNP  HPI/Recap of past 24 hours: HPI from Dr Patrick Jupiter is a 40 y.o. female with history of left shoulder pain status post surgery last year, history of asthma, depression has been experiencing bilateral flank pain for the last 48 hours gradually worsened, associated with multiple episodes of nausea/vomiting, denies any associated dysuria or any vaginal discharges. In the ED, pt was tachycardic, febrile with temperature of 100.3 F.  Labs significant for leukocytosis of 26,000, potassium of 2.9 and creatinine 1.3.  UA is concerning for UTI with many bacteria large number of WBCs leukocyte esterase positive but squamous cells also positive.  CT abdomen pelvis done shows bilateral perinephric stranding concerning for pyelonephritis.  CT lumbar spine was not showing anything acute. COVID test was negative chest x-ray unremarkable. EKG shows sinus tachycardia. Blood cultures were obtained and started on empiric antibiotics for pyelonephritis and admitted for further management.      Today, patient denies any new complaints, still with some b/l flank tenderness, denies any nausea/vomiting, fever/chills.    Assessment/Plan: Principal Problem:   Sepsis without acute organ dysfunction (HCC) Active Problems:   Acute pyelonephritis   Hypokalemia   Sepsis ([place])   Sepsis likely 2/2 acute pyelonephritis E. coli bacteremia On admission, pt was tachycardic, leukocytosis Currently afebrile, with leukocytosis UA showed large leukocytes, many bacteria, >50 WBC UC with multiple species BC growing E. Coli, awaiting susceptibilities CT abd/pelvis showed bilateral perinephric stranding concerning for pyelonephritis IV Ceftriaxone D/C IVF, pain management Monitor closely  ?AKI Likely 2/2 above Encourage oral intake Daily BMP  Hypokalemia Replace as needed  History of  asthma Stable Use Singulair, inhalers/DuoNebs as needed  Chronic left shoulder pain Continue PTA pain regimen  History of depression Continue Paxil, remeron  Morbid obesity Lifestyle modification advised Estimated body mass index is 42.16 kg/m as calculated from the following:   Height as of this encounter: 5\' 3"  (1.6 m).   Weight as of this encounter: 108 kg.      Code Status: Full  Family Communication: None at bedside  Disposition Plan: Status is: Inpatient  The patient will require care spanning > 2 midnights and should be moved to inpatient because: Inpatient level of care appropriate due to severity of illness  Dispo: The patient is from: Home              Anticipated d/c is to: Home              Patient currently is not medically stable to d/c.   Difficult to place patient No   Consultants:  None  Procedures: None  Antimicrobials:  Ceftriaxone  DVT prophylaxis:  lovenox   Objective: Vitals:   01/26/21 1043 01/26/21 1818 01/26/21 2032 01/27/21 0512  BP: 131/82 127/87 134/90 (!) 140/96  Pulse: 97 98 93 97  Resp: 17 18 20 18   Temp: 100 F (37.8 C) 98.5 F (36.9 C) 99 F (37.2 C) 98.3 F (36.8 C)  TempSrc: Oral Oral Oral Oral  SpO2: 100% 98% 100% 99%  Weight:      Height:        Intake/Output Summary (Last 24 hours) at 01/27/2021 1436 Last data filed at 01/27/2021 1343 Gross per 24 hour  Intake 1580 ml  Output --  Net 1580 ml   Filed Weights   01/25/21 1741  Weight: 108 kg    Exam:  General: NAD   Cardiovascular: S1, S2 present  Respiratory: CTAB  Abdomen: Soft, bilateral flank tenderness, obese, bowel sounds present  Musculoskeletal: No bilateral pedal edema noted  Skin: Normal, multiple tattoos noted   Psychiatry: Normal mood   Data Reviewed: CBC: Recent Labs  Lab 01/25/21 1909 01/26/21 0630 01/27/21 0332  WBC 26.1* 20.7* 19.0*  NEUTROABS 20.2* 16.8* 14.6*  HGB 13.2 12.7 12.8  HCT 38.6 38.3 39.0  MCV 86.9 88.5  88.0  PLT 230 176 992   Basic Metabolic Panel: Recent Labs  Lab 01/25/21 1909 01/26/21 0630 01/27/21 0332  NA 135 139 138  K 2.9* 3.6 3.6  CL 101 108 108  CO2 23 24 22   GLUCOSE 114* 103* 80  BUN 13 8 7   CREATININE 1.38* 1.40* 1.39*  CALCIUM 9.3 9.0 9.0  MG  --  2.0  --    GFR: Estimated Creatinine Clearance: 65.3 mL/min (A) (by C-G formula based on SCr of 1.39 mg/dL (H)). Liver Function Tests: Recent Labs  Lab 01/25/21 1909 01/26/21 0630 01/27/21 0332  AST 22 19 17   ALT 25 21 19   ALKPHOS 133* 125 132*  BILITOT 1.3* 1.3* 1.2  PROT 7.6 6.1* 6.2*  ALBUMIN 3.1* 2.5* 2.2*   No results for input(s): LIPASE, AMYLASE in the last 168 hours. No results for input(s): AMMONIA in the last 168 hours. Coagulation Profile: No results for input(s): INR, PROTIME in the last 168 hours. Cardiac Enzymes: No results for input(s): CKTOTAL, CKMB, CKMBINDEX, TROPONINI in the last 168 hours. BNP (last 3 results) No results for input(s): PROBNP in the last 8760 hours. HbA1C: No results for input(s): HGBA1C in the last 72 hours. CBG: No results for input(s): GLUCAP in the last 168 hours. Lipid Profile: No results for input(s): CHOL, HDL, LDLCALC, TRIG, CHOLHDL, LDLDIRECT in the last 72 hours. Thyroid Function Tests: Recent Labs    01/26/21 0630  TSH 1.966   Anemia Panel: No results for input(s): VITAMINB12, FOLATE, FERRITIN, TIBC, IRON, RETICCTPCT in the last 72 hours. Urine analysis:    Component Value Date/Time   COLORURINE YELLOW 01/25/2021 1909   APPEARANCEUR CLOUDY (A) 01/25/2021 1909   LABSPEC <1.005 (L) 01/25/2021 1909   PHURINE 6.5 01/25/2021 1909   GLUCOSEU NEGATIVE 01/25/2021 1909   HGBUR LARGE (A) 01/25/2021 1909   BILIRUBINUR NEGATIVE 01/25/2021 [place] NEGATIVE 01/25/2021 1909   PROTEINUR 30 (A) 01/25/2021 1909   NITRITE NEGATIVE 01/25/2021 1909   LEUKOCYTESUR LARGE (A) 01/25/2021 1909   Sepsis  Labs: @LABRCNTIP (procalcitonin:4,lacticidven:4)  ) Recent Results (from the past 240 hour(s))  Urine culture     Status: Abnormal   Collection Time: 01/25/21  7:09 PM   Specimen: In/Out Cath Urine  Result Value Ref Range Status   Specimen Description   Final    IN/OUT CATH URINE Performed at [facility], [address] [place], [place] [address]    Special Requests   Final    NONE Performed at [facility], [address] [place], [place] [address]    Culture MULTIPLE SPECIES PRESENT, SUGGEST RECOLLECTION (A)  Final   Report Status 01/27/2021 FINAL  Final  Resp Panel by RT-PCR (Flu A&B, Covid) Nasopharyngeal Swab     Status: None   Collection Time: 01/25/21  7:09 PM   Specimen: Nasopharyngeal Swab; Nasopharyngeal(NP) swabs in vial transport medium  Result Value Ref Range Status   SARS Coronavirus 2 by RT PCR NEGATIVE NEGATIVE Final    Comment: (NOTE) SARS-CoV-2 target nucleic acids are NOT DETECTED.  The  SARS-CoV-2 RNA is generally detectable in upper respiratory specimens during the acute phase of infection. The lowest concentration of SARS-CoV-2 viral copies this assay can detect is 138 copies/mL. A negative result does not preclude SARS-Cov-2 infection and should not be used as the sole basis for treatment or other patient management decisions. A negative result may occur with  improper specimen collection/handling, submission of specimen other than nasopharyngeal swab, presence of viral mutation(s) within the areas targeted by this assay, and inadequate number of viral copies(<138 copies/mL). A negative result must be combined with clinical observations, patient history, and epidemiological information. The expected result is Negative.  Fact Sheet for Patients:  EntrepreneurPulse.com.au  Fact Sheet for Healthcare Providers:  IncredibleEmployment.be  This test is no t yet approved or cleared by the [place] FDA and  has been authorized for detection and/or diagnosis of SARS-CoV-2 by FDA under an Emergency Use Authorization (EUA). This EUA will remain  in effect (meaning this test can be used) for the duration of the COVID-19 declaration under Section 564(b)(1) of the Act, 21 U.S.C.section 360bbb-3(b)(1), unless the authorization is terminated  or revoked sooner.       Influenza A by PCR NEGATIVE NEGATIVE Final   Influenza B by PCR NEGATIVE NEGATIVE Final    Comment: (NOTE) The Xpert Xpress SARS-CoV-2/FLU/RSV plus assay is intended as an aid in the diagnosis of influenza from Nasopharyngeal swab specimens and should not be used as a sole basis for treatment. Nasal washings and aspirates are unacceptable for Xpert Xpress SARS-CoV-2/FLU/RSV testing.  Fact Sheet for Patients: EntrepreneurPulse.com.au  Fact Sheet for Healthcare Providers: IncredibleEmployment.be  This test is not yet approved or cleared by the [place] FDA and has been authorized for detection and/or diagnosis of SARS-CoV-2 by FDA under an Emergency Use Authorization (EUA). This EUA will remain in effect (meaning this test can be used) for the duration of the COVID-19 declaration under Section 564(b)(1) of the Act, 21 U.S.C. section 360bbb-3(b)(1), unless the authorization is terminated or revoked.  Performed at [facility], [address] [place], [place] [address]   Blood culture (routine single)     Status: None (Preliminary result)   Collection Time: 01/25/21  7:10 PM   Specimen: BLOOD  Result Value Ref Range Status   Specimen Description   Final    BLOOD RIGHT ANTECUBITAL Performed at [facility], [address] [place], [place] [address]    Special Requests   Final    BOTTLES DRAWN AEROBIC AND ANAEROBIC Blood Culture adequate volume Performed at [facility], [address] [place], [place] [address]    Culture  Setup  Time   Final    GRAM NEGATIVE RODS AEROBIC BOTTLE ONLY IDENTIFICATION TO FOLLOW CRITICAL RESULT CALLED TO, READ BACK BY AND VERIFIED WITHJonetta Speak PHARMD 1410 01/26/21 A BROWNING Performed at [facility], [place] [address] [place], [place] [address]    Culture GRAM NEGATIVE RODS  Final   Report Status PENDING  Incomplete  Culture, blood (single)     Status: Abnormal (Preliminary result)   Collection Time: 01/25/21  9:01 PM   Specimen: BLOOD  Result Value Ref Range Status   Specimen Description   Final    BLOOD BLOOD RIGHT WRIST Performed at [facility], [address] [place], [place] [address]    Special Requests   Final    BOTTLES DRAWN AEROBIC AND ANAEROBIC Blood Culture adequate volume  Performed at [facility], [address] [place], [place] [address]    Culture  Setup Time   Final    GRAM NEGATIVE RODS IN BOTH AEROBIC AND ANAEROBIC BOTTLES CRITICAL RESULT CALLED TO, READ BACK BY AND VERIFIED WITHJonetta Speak PHARMD 1410 01/26/21 A BROWNING    Culture (A)  Final    ESCHERICHIA COLI SUSCEPTIBILITIES TO FOLLOW Performed at [facility], [place] [address], [place], [place] [address]    Report Status PENDING  Incomplete  Blood Culture ID Panel (Reflexed)     Status: Abnormal   Collection Time: 01/25/21  9:01 PM  Result Value Ref Range Status   Enterococcus faecalis NOT DETECTED NOT DETECTED Final   Enterococcus Faecium NOT DETECTED NOT DETECTED Final   Listeria monocytogenes NOT DETECTED NOT DETECTED Final   Staphylococcus species NOT DETECTED NOT DETECTED Final   Staphylococcus aureus (BCID) NOT DETECTED NOT DETECTED Final   Staphylococcus epidermidis NOT DETECTED NOT DETECTED Final   Staphylococcus lugdunensis NOT DETECTED NOT DETECTED Final   Streptococcus species NOT DETECTED NOT DETECTED Final   Streptococcus agalactiae NOT DETECTED NOT DETECTED Final   Streptococcus pneumoniae NOT DETECTED NOT DETECTED Final   Streptococcus pyogenes NOT  DETECTED NOT DETECTED Final   A.calcoaceticus-baumannii NOT DETECTED NOT DETECTED Final   Bacteroides fragilis NOT DETECTED NOT DETECTED Final   Enterobacterales DETECTED (A) NOT DETECTED Final    Comment: Enterobacterales represent a large order of gram negative bacteria, not a single organism. CRITICAL RESULT CALLED TO, READ BACK BY AND VERIFIED WITH: Jonetta Speak PHARMD 1410 01/26/21 A BROWNING    Enterobacter cloacae complex NOT DETECTED NOT DETECTED Final   Escherichia coli DETECTED (A) NOT DETECTED Final    Comment: CRITICAL RESULT CALLED TO, READ BACK BY AND VERIFIED WITHJonetta Speak PHARMD 1410 01/26/21 A BROWNING    Klebsiella aerogenes NOT DETECTED NOT DETECTED Final   Klebsiella oxytoca NOT DETECTED NOT DETECTED Final   Klebsiella pneumoniae NOT DETECTED NOT DETECTED Final   Proteus species NOT DETECTED NOT DETECTED Final   Salmonella species NOT DETECTED NOT DETECTED Final   Serratia marcescens NOT DETECTED NOT DETECTED Final   Haemophilus influenzae NOT DETECTED NOT DETECTED Final   Neisseria meningitidis NOT DETECTED NOT DETECTED Final   Pseudomonas aeruginosa NOT DETECTED NOT DETECTED Final   Stenotrophomonas maltophilia NOT DETECTED NOT DETECTED Final   Candida albicans NOT DETECTED NOT DETECTED Final   Candida auris NOT DETECTED NOT DETECTED Final   Candida glabrata NOT DETECTED NOT DETECTED Final   Candida krusei NOT DETECTED NOT DETECTED Final   Candida parapsilosis NOT DETECTED NOT DETECTED Final   Candida tropicalis NOT DETECTED NOT DETECTED Final   Cryptococcus neoformans/gattii NOT DETECTED NOT DETECTED Final   CTX-M ESBL NOT DETECTED NOT DETECTED Final   Carbapenem resistance IMP NOT DETECTED NOT DETECTED Final   Carbapenem resistance KPC NOT DETECTED NOT DETECTED Final   Carbapenem resistance NDM NOT DETECTED NOT DETECTED Final   Carbapenem resist OXA 48 LIKE NOT DETECTED NOT DETECTED Final   Carbapenem resistance VIM NOT DETECTED NOT DETECTED Final    Comment:  Performed at [facility], [address] [place], [place] [address]      Studies: No results found.  Scheduled Meds: . diclofenac Sodium  2 g Topical QID  . enoxaparin (LOVENOX) injection  55 mg Subcutaneous Q24H  . gabapentin  300 mg Oral QHS  . lidocaine  1 patch Transdermal Q24H  . mirtazapine  30  mg Oral QHS  . montelukast  10 mg Oral QHS  . PARoxetine  40 mg Oral Daily    Continuous Infusions: . cefTRIAXone (ROCEPHIN)  IV Stopped (01/27/21 0502)     LOS: 0 days     Alma Friendly, MD [facility]  If 7PM-7AM, please contact night-coverage [email or web address] 01/27/2021, 2:36 PM

## 2021-01-28 LAB — CULTURE, BLOOD (SINGLE)
Special Requests: ADEQUATE
Special Requests: ADEQUATE

## 2021-01-28 LAB — CBC WITH DIFFERENTIAL/PLATELET
Abs Immature Granulocytes: 0.33 10*3/uL — ABNORMAL HIGH (ref 0.00–0.07)
Basophils Absolute: 0.1 10*3/uL (ref 0.0–0.1)
Basophils Relative: 1 %
Eosinophils Absolute: 0.2 10*3/uL (ref 0.0–0.5)
Eosinophils Relative: 1 %
HCT: 36.3 % (ref 36.0–46.0)
Hemoglobin: 12.3 g/dL (ref 12.0–15.0)
Immature Granulocytes: 2 %
Lymphocytes Relative: 18 %
Lymphs Abs: 2.6 10*3/uL (ref 0.7–4.0)
MCH: 29 pg (ref 26.0–34.0)
MCHC: 33.9 g/dL (ref 30.0–36.0)
MCV: 85.6 fL (ref 80.0–100.0)
Monocytes Absolute: 1.3 10*3/uL — ABNORMAL HIGH (ref 0.1–1.0)
Monocytes Relative: 9 %
Neutro Abs: 10.1 10*3/uL — ABNORMAL HIGH (ref 1.7–7.7)
Neutrophils Relative %: 69 %
Platelets: 244 10*3/uL (ref 150–400)
RBC: 4.24 MIL/uL (ref 3.87–5.11)
RDW: 14.5 % (ref 11.5–15.5)
WBC: 14.6 10*3/uL — ABNORMAL HIGH (ref 4.0–10.5)
nRBC: 0 % (ref 0.0–0.2)

## 2021-01-28 LAB — COMPREHENSIVE METABOLIC PANEL
ALT: 18 U/L (ref 0–44)
AST: 17 U/L (ref 15–41)
Albumin: 2 g/dL — ABNORMAL LOW (ref 3.5–5.0)
Alkaline Phosphatase: 127 U/L — ABNORMAL HIGH (ref 38–126)
Anion gap: 6 (ref 5–15)
BUN: 8 mg/dL (ref 6–20)
CO2: 23 mmol/L (ref 22–32)
Calcium: 9.2 mg/dL (ref 8.9–10.3)
Chloride: 108 mmol/L (ref 98–111)
Creatinine, Ser: 1.35 mg/dL — ABNORMAL HIGH (ref 0.44–1.00)
GFR, Estimated: 52 mL/min — ABNORMAL LOW (ref >60)
Glucose, Bld: 95 mg/dL (ref 70–99)
Potassium: 3.3 mmol/L — ABNORMAL LOW (ref 3.5–5.1)
Sodium: 137 mmol/L (ref 135–145)
Total Bilirubin: 0.7 mg/dL (ref 0.3–1.2)
Total Protein: 6 g/dL — ABNORMAL LOW (ref 6.5–8.1)

## 2021-01-28 MED ORDER — QUETIAPINE FUMARATE 50 MG PO TABS
150.0000 mg | ORAL_TABLET | Freq: Every day | ORAL | Status: DC
  Administered 2021-01-28 – 2021-01-29 (×2): 150 mg via ORAL
  Filled 2021-01-28 (×2): qty 1

## 2021-01-28 MED ORDER — HYDROCODONE-ACETAMINOPHEN 5-325 MG PO TABS
1.0000 | ORAL_TABLET | Freq: Four times a day (QID) | ORAL | Status: DC | PRN
Start: 2021-01-28 — End: 2021-01-30

## 2021-01-28 MED ORDER — SENNOSIDES-DOCUSATE SODIUM 8.6-50 MG PO TABS
1.0000 | ORAL_TABLET | Freq: Two times a day (BID) | ORAL | Status: DC
  Administered 2021-01-28 – 2021-01-29 (×4): 1 via ORAL
  Filled 2021-01-28 (×5): qty 1

## 2021-01-28 MED ORDER — POLYETHYLENE GLYCOL 3350 17 G PO PACK
17.0000 g | PACK | Freq: Two times a day (BID) | ORAL | Status: DC
  Administered 2021-01-28 (×2): 17 g via ORAL
  Filled 2021-01-28 (×4): qty 1

## 2021-01-28 MED ORDER — DULOXETINE HCL 60 MG PO CPEP
60.0000 mg | ORAL_CAPSULE | Freq: Every day | ORAL | Status: DC
  Administered 2021-01-29 – 2021-01-30 (×2): 60 mg via ORAL
  Filled 2021-01-28 (×2): qty 1

## 2021-01-28 MED ORDER — POTASSIUM CHLORIDE CRYS ER 20 MEQ PO TBCR
40.0000 meq | EXTENDED_RELEASE_TABLET | Freq: Once | ORAL | Status: AC
  Administered 2021-01-28: 40 meq via ORAL
  Filled 2021-01-28: qty 2

## 2021-01-28 NOTE — Progress Notes (Signed)
PROGRESS NOTE  Cynthia Reese EPP:[id number] DOB: [birth year] DOA: 01/25/2021 PCP: Karie Mainland, FNP  HPI/Recap of past 24 hours: HPI from Dr Patrick Jupiter is a 40 y.o. female with history of left shoulder pain status post surgery last year, history of asthma, depression has been experiencing bilateral flank pain for the last 48 hours gradually worsened, associated with multiple episodes of nausea/vomiting, denies any associated dysuria or any vaginal discharges. In the ED, pt was tachycardic, febrile with temperature of 100.3 F.  Labs significant for leukocytosis of 26,000, potassium of 2.9 and creatinine 1.3.  UA is concerning for UTI with many bacteria large number of WBCs leukocyte esterase positive but squamous cells also positive.  CT abdomen pelvis done shows bilateral perinephric stranding concerning for pyelonephritis.  CT lumbar spine was not showing anything acute. COVID test was negative chest x-ray unremarkable. EKG shows sinus tachycardia. Blood cultures were obtained and started on empiric antibiotics for pyelonephritis and admitted for further management.      Today, patient denies any new complaints, still with some b/l flank tenderness, denies any nausea/vomiting, fever/chills.    Assessment/Plan: Principal Problem:   Sepsis without acute organ dysfunction (HCC) Active Problems:   Acute pyelonephritis   Hypokalemia   Sepsis ([place])   Sepsis likely 2/2 acute pyelonephritis E. coli bacteremia On admission, pt was tachycardic, leukocytosis Currently afebrile, with leukocytosis UA showed large leukocytes, many bacteria, >50 WBC UC with multiple species BC growing E. Coli, awaiting susceptibilities CT abd/pelvis showed bilateral perinephric stranding concerning for pyelonephritis IV Ceftriaxone D/C IVF, pain management Monitor closely  ?AKI Likely 2/2 above Encourage oral intake Daily BMP  Hypokalemia Replace as needed  History of  asthma Stable Use Singulair, inhalers/DuoNebs as needed  Chronic left shoulder pain Continue PTA pain regimen  History of depression Continue Paxil, remeron  Morbid obesity Lifestyle modification advised Estimated body mass index is 42.16 kg/m as calculated from the following:   Height as of this encounter: 5\' 3"  (1.6 m).   Weight as of this encounter: 108 kg.      Code Status: Full  Family Communication: None at bedside  Disposition Plan: Status is: Inpatient  The patient will require care spanning > 2 midnights and should be moved to inpatient because: Inpatient level of care appropriate due to severity of illness  Dispo: The patient is from: Home              Anticipated d/c is to: Home              Patient currently is not medically stable to d/c.   Difficult to place patient No   Consultants:  None  Procedures: None  Antimicrobials:  Ceftriaxone  DVT prophylaxis:  lovenox   Objective: Vitals:   01/27/21 1800 01/27/21 2005 01/28/21 0503 01/28/21 0932  BP: (!) 147/97 138/85 134/82 (!) 146/91  Pulse: 86 75 81 85  Resp: 18 18 18 20   Temp: 98.5 F (36.9 C) 99.1 F (37.3 C) 98.1 F (36.7 C) 97.9 F (36.6 C)  TempSrc: Oral Oral Oral Oral  SpO2: 100% 97% 96% 97%  Weight:      Height:        Intake/Output Summary (Last 24 hours) at 01/28/2021 1632 Last data filed at 01/28/2021 1400 Gross per 24 hour  Intake 1300.06 ml  Output 0 ml  Net 1300.06 ml   Filed Weights   01/25/21 1741  Weight: 108 kg    Exam:  General: NAD   Cardiovascular: S1,  S2 present  Respiratory: CTAB  Abdomen: Soft, mild bilateral flank tenderness, obese, bowel sounds present  Musculoskeletal: No bilateral pedal edema noted  Skin: Normal, multiple tattoos noted   Psychiatry: Normal mood   Data Reviewed: CBC: Recent Labs  Lab 01/25/21 1909 01/26/21 0630 01/27/21 0332 01/28/21 0427  WBC 26.1* 20.7* 19.0* 14.6*  NEUTROABS 20.2* 16.8* 14.6* 10.1*  HGB 13.2  12.7 12.8 12.3  HCT 38.6 38.3 39.0 36.3  MCV 86.9 88.5 88.0 85.6  PLT 230 176 189 671   Basic Metabolic Panel: Recent Labs  Lab 01/25/21 1909 01/26/21 0630 01/27/21 0332 01/28/21 0427  NA 135 139 138 137  K 2.9* 3.6 3.6 3.3*  CL 101 108 108 108  CO2 23 24 22 23   GLUCOSE 114* 103* 80 95  BUN 13 8 7 8   CREATININE 1.38* 1.40* 1.39* 1.35*  CALCIUM 9.3 9.0 9.0 9.2  MG  --  2.0  --   --    GFR: Estimated Creatinine Clearance: 67.2 mL/min (A) (by C-G formula based on SCr of 1.35 mg/dL (H)). Liver Function Tests: Recent Labs  Lab 01/25/21 1909 01/26/21 0630 01/27/21 0332 01/28/21 0427  AST 22 19 17 17   ALT 25 21 19 18   ALKPHOS 133* 125 132* 127*  BILITOT 1.3* 1.3* 1.2 0.7  PROT 7.6 6.1* 6.2* 6.0*  ALBUMIN 3.1* 2.5* 2.2* 2.0*   No results for input(s): LIPASE, AMYLASE in the last 168 hours. No results for input(s): AMMONIA in the last 168 hours. Coagulation Profile: No results for input(s): INR, PROTIME in the last 168 hours. Cardiac Enzymes: No results for input(s): CKTOTAL, CKMB, CKMBINDEX, TROPONINI in the last 168 hours. BNP (last 3 results) No results for input(s): PROBNP in the last 8760 hours. HbA1C: No results for input(s): HGBA1C in the last 72 hours. CBG: No results for input(s): GLUCAP in the last 168 hours. Lipid Profile: No results for input(s): CHOL, HDL, LDLCALC, TRIG, CHOLHDL, LDLDIRECT in the last 72 hours. Thyroid Function Tests: Recent Labs    01/26/21 0630  TSH 1.966   Anemia Panel: No results for input(s): VITAMINB12, FOLATE, FERRITIN, TIBC, IRON, RETICCTPCT in the last 72 hours. Urine analysis:    Component Value Date/Time   COLORURINE YELLOW 01/25/2021 1909   APPEARANCEUR CLOUDY (A) 01/25/2021 1909   LABSPEC <1.005 (L) 01/25/2021 1909   PHURINE 6.5 01/25/2021 1909   GLUCOSEU NEGATIVE 01/25/2021 1909   HGBUR LARGE (A) 01/25/2021 1909   BILIRUBINUR NEGATIVE 01/25/2021 [place] NEGATIVE 01/25/2021 1909   PROTEINUR 30 (A)  01/25/2021 1909   NITRITE NEGATIVE 01/25/2021 1909   LEUKOCYTESUR LARGE (A) 01/25/2021 1909   Sepsis Labs: @LABRCNTIP (procalcitonin:4,lacticidven:4)  ) Recent Results (from the past 240 hour(s))  Urine culture     Status: Abnormal   Collection Time: 01/25/21  7:09 PM   Specimen: In/Out Cath Urine  Result Value Ref Range Status   Specimen Description   Final    IN/OUT CATH URINE Performed at [facility], [address] [place], [place] [address]    Special Requests   Final    NONE Performed at [facility], [address] [place], [place] [address]    Culture MULTIPLE SPECIES PRESENT, SUGGEST RECOLLECTION (A)  Final   Report Status 01/27/2021 FINAL  Final  Resp Panel by RT-PCR (Flu A&B, Covid) Nasopharyngeal Swab     Status: None   Collection Time: 01/25/21  7:09 PM   Specimen: Nasopharyngeal Swab; Nasopharyngeal(NP) swabs in vial  transport medium  Result Value Ref Range Status   SARS Coronavirus 2 by RT PCR NEGATIVE NEGATIVE Final    Comment: (NOTE) SARS-CoV-2 target nucleic acids are NOT DETECTED.  The SARS-CoV-2 RNA is generally detectable in upper respiratory specimens during the acute phase of infection. The lowest concentration of SARS-CoV-2 viral copies this assay can detect is 138 copies/mL. A negative result does not preclude SARS-Cov-2 infection and should not be used as the sole basis for treatment or other patient management decisions. A negative result may occur with  improper specimen collection/handling, submission of specimen other than nasopharyngeal swab, presence of viral mutation(s) within the areas targeted by this assay, and inadequate number of viral copies(<138 copies/mL). A negative result must be combined with clinical observations, patient history, and epidemiological information. The expected result is Negative.  Fact Sheet for Patients:  EntrepreneurPulse.com.au  Fact Sheet for Healthcare Providers:   IncredibleEmployment.be  This test is no t yet approved or cleared by the [place] FDA and  has been authorized for detection and/or diagnosis of SARS-CoV-2 by FDA under an Emergency Use Authorization (EUA). This EUA will remain  in effect (meaning this test can be used) for the duration of the COVID-19 declaration under Section 564(b)(1) of the Act, 21 U.S.C.section 360bbb-3(b)(1), unless the authorization is terminated  or revoked sooner.       Influenza A by PCR NEGATIVE NEGATIVE Final   Influenza B by PCR NEGATIVE NEGATIVE Final    Comment: (NOTE) The Xpert Xpress SARS-CoV-2/FLU/RSV plus assay is intended as an aid in the diagnosis of influenza from Nasopharyngeal swab specimens and should not be used as a sole basis for treatment. Nasal washings and aspirates are unacceptable for Xpert Xpress SARS-CoV-2/FLU/RSV testing.  Fact Sheet for Patients: EntrepreneurPulse.com.au  Fact Sheet for Healthcare Providers: IncredibleEmployment.be  This test is not yet approved or cleared by the [place] FDA and has been authorized for detection and/or diagnosis of SARS-CoV-2 by FDA under an Emergency Use Authorization (EUA). This EUA will remain in effect (meaning this test can be used) for the duration of the COVID-19 declaration under Section 564(b)(1) of the Act, 21 U.S.C. section 360bbb-3(b)(1), unless the authorization is terminated or revoked.  Performed at [facility], [address] [place], [place] [address]   Blood culture (routine single)     Status: Abnormal   Collection Time: 01/25/21  7:10 PM   Specimen: BLOOD  Result Value Ref Range Status   Specimen Description   Final    BLOOD RIGHT ANTECUBITAL Performed at [place] [facility], [address] [place], [place] [address]    Special Requests   Final    BOTTLES DRAWN AEROBIC AND ANAEROBIC Blood Culture adequate volume Performed at [facility], [address] [place], [place] [address]    Culture  Setup Time   Final    GRAM NEGATIVE RODS AEROBIC BOTTLE ONLY CRITICAL RESULT CALLED TO, READ BACK BY AND VERIFIED WITHJonetta Speak PHARMD 1410 01/26/21 A BROWNING    Culture (A)  Final    ESCHERICHIA COLI SUSCEPTIBILITIES PERFORMED ON PREVIOUS CULTURE WITHIN THE LAST 5 DAYS. Performed at [facility], [place] [address], [place], [place] [address]    Report Status 01/28/2021 FINAL  Final  Culture, blood (single)     Status: Abnormal   Collection Time: 01/25/21  9:01 PM   Specimen: BLOOD  Result Value Ref Range Status   Specimen Description   Final  BLOOD BLOOD RIGHT WRIST Performed at [facility], [address] [place], [place] [address]    Special Requests   Final    BOTTLES DRAWN AEROBIC AND ANAEROBIC Blood Culture adequate volume Performed at [facility], [address] [place], [place] [address]    Culture  Setup Time   Final    GRAM NEGATIVE RODS IN BOTH AEROBIC AND ANAEROBIC BOTTLES CRITICAL RESULT CALLED TO, READ BACK BY AND VERIFIED WITHJonetta Speak [facility] [id number] 01/26/21 A BROWNING Performed at [facility], [place] [address] [place] [address], [place], [place] [address]    Culture ESCHERICHIA COLI (A)  Final   Report Status 01/28/2021 FINAL  Final   Organism ID, Bacteria ESCHERICHIA COLI  Final      Susceptibility   Escherichia coli - MIC*    AMPICILLIN >=32 RESISTANT Resistant     CEFAZOLIN <=4 SENSITIVE Sensitive     CEFEPIME <=0.12 SENSITIVE Sensitive     CEFTAZIDIME <=1 SENSITIVE Sensitive     CEFTRIAXONE <=0.25 SENSITIVE Sensitive     CIPROFLOXACIN <=0.25 SENSITIVE Sensitive     GENTAMICIN <=1 SENSITIVE Sensitive     IMIPENEM <=0.25 SENSITIVE Sensitive     TRIMETH/SULFA <=20 SENSITIVE Sensitive     AMPICILLIN/SULBACTAM >=32 RESISTANT Resistant     PIP/TAZO <=4 SENSITIVE Sensitive     * ESCHERICHIA COLI  Blood Culture ID Panel (Reflexed)     Status: Abnormal   Collection  Time: 01/25/21  9:01 PM  Result Value Ref Range Status   Enterococcus faecalis NOT DETECTED NOT DETECTED Final   Enterococcus Faecium NOT DETECTED NOT DETECTED Final   Listeria monocytogenes NOT DETECTED NOT DETECTED Final   Staphylococcus species NOT DETECTED NOT DETECTED Final   Staphylococcus aureus (BCID) NOT DETECTED NOT DETECTED Final   Staphylococcus epidermidis NOT DETECTED NOT DETECTED Final   Staphylococcus lugdunensis NOT DETECTED NOT DETECTED Final   Streptococcus species NOT DETECTED NOT DETECTED Final   Streptococcus agalactiae NOT DETECTED NOT DETECTED Final   Streptococcus pneumoniae NOT DETECTED NOT DETECTED Final   Streptococcus pyogenes NOT DETECTED NOT DETECTED Final   A.calcoaceticus-baumannii NOT DETECTED NOT DETECTED Final   Bacteroides fragilis NOT DETECTED NOT DETECTED Final   Enterobacterales DETECTED (A) NOT DETECTED Final    Comment: Enterobacterales represent a large order of gram negative bacteria, not a single organism. CRITICAL RESULT CALLED TO, READ BACK BY AND VERIFIED WITH: Jonetta Speak [id number] 01/26/21 A BROWNING    Enterobacter cloacae complex NOT DETECTED NOT DETECTED Final   Escherichia coli DETECTED (A) NOT DETECTED Final    Comment: CRITICAL RESULT CALLED TO, READ BACK BY AND VERIFIED WITHJonetta Speak PHARMD 1410 01/26/21 A BROWNING    Klebsiella aerogenes NOT DETECTED NOT DETECTED Final   Klebsiella oxytoca NOT DETECTED NOT DETECTED Final   Klebsiella pneumoniae NOT DETECTED NOT DETECTED Final   Proteus species NOT DETECTED NOT DETECTED Final   Salmonella species NOT DETECTED NOT DETECTED Final   Serratia marcescens NOT DETECTED NOT DETECTED Final   Haemophilus influenzae NOT DETECTED NOT DETECTED Final   Neisseria meningitidis NOT DETECTED NOT DETECTED Final   Pseudomonas aeruginosa NOT DETECTED NOT DETECTED Final   Stenotrophomonas maltophilia NOT DETECTED NOT DETECTED Final   Candida albicans NOT DETECTED NOT DETECTED Final   Candida auris NOT  DETECTED NOT DETECTED Final   Candida glabrata NOT DETECTED NOT DETECTED Final   Candida krusei NOT DETECTED NOT DETECTED Final   Candida parapsilosis NOT DETECTED NOT DETECTED Final  Candida tropicalis NOT DETECTED NOT DETECTED Final   Cryptococcus neoformans/gattii NOT DETECTED NOT DETECTED Final   CTX-M ESBL NOT DETECTED NOT DETECTED Final   Carbapenem resistance IMP NOT DETECTED NOT DETECTED Final   Carbapenem resistance KPC NOT DETECTED NOT DETECTED Final   Carbapenem resistance NDM NOT DETECTED NOT DETECTED Final   Carbapenem resist OXA 48 LIKE NOT DETECTED NOT DETECTED Final   Carbapenem resistance VIM NOT DETECTED NOT DETECTED Final    Comment: Performed at [facility], [place] [address] [place], [place] [address]      Studies: No results found.  Scheduled Meds: . diclofenac Sodium  2 g Topical QID  . [START ON 01/29/2021] DULoxetine  60 mg Oral Daily  . enoxaparin (LOVENOX) injection  55 mg Subcutaneous Q24H  . lidocaine  1 patch Transdermal Q24H  . polyethylene glycol  17 g Oral BID  . QUEtiapine  150 mg Oral QHS  . senna-docusate  1 tablet Oral BID    Continuous Infusions: . cefTRIAXone (ROCEPHIN)  IV 2 g (01/27/21 1945)     LOS: 1 day     Alma Friendly, MD [facility]  If 7PM-7AM, please contact night-coverage [email or web address] 01/28/2021, 4:32 PM

## 2021-01-28 NOTE — Plan of Care (Signed)
  Problem: Clinical Measurements: Goal: Signs and symptoms of infection will decrease Outcome: Progressing   

## 2021-01-28 NOTE — Progress Notes (Signed)
PROGRESS NOTE  Cynthia Reese [id number] DOB: 07/28/1981 DOA: 01/25/2021 PCP: Karie Mainland, FNP  HPI/Recap of past 24 hours: HPI from Dr Patrick Jupiter is a 40 y.o. female with history of left shoulder pain status post surgery last year, history of asthma, depression has been experiencing bilateral flank pain for the last 48 hours gradually worsened, associated with multiple episodes of nausea/vomiting, denies any associated dysuria or any vaginal discharges. In the ED, pt was tachycardic, febrile with temperature of 100.3 F.  Labs significant for leukocytosis of 26,000, potassium of 2.9 and creatinine 1.3.  UA is concerning for UTI with many bacteria large number of WBCs leukocyte esterase positive but squamous cells also positive.  CT abdomen pelvis done shows bilateral perinephric stranding concerning for pyelonephritis.  CT lumbar spine was not showing anything acute. COVID test was negative chest x-ray unremarkable. EKG shows sinus tachycardia. Blood cultures were obtained and started on empiric antibiotics for pyelonephritis and admitted for further management.      Today, patient denies any new complaints, still with some b/l flank tenderness, denies any nausea/vomiting, fever/chills.    Assessment/Plan: Principal Problem:   Sepsis without acute organ dysfunction (HCC) Active Problems:   Acute pyelonephritis   Hypokalemia   Sepsis ([place])   Sepsis likely 2/2 acute pyelonephritis E. coli bacteremia On admission, pt was tachycardic, leukocytosis Currently afebrile, with leukocytosis UA showed large leukocytes, many bacteria, >50 WBC UC with multiple species BC growing E. Coli, awaiting susceptibilities CT abd/pelvis showed bilateral perinephric stranding concerning for pyelonephritis IV Ceftriaxone D/C IVF, pain management Monitor closely  ?AKI Likely 2/2 above Encourage oral intake Daily BMP  Hypokalemia Replace as needed  History of  asthma Stable Use Singulair, inhalers/DuoNebs as needed  Chronic left shoulder pain Continue PTA pain regimen  History of depression Continue Paxil, remeron  Morbid obesity Lifestyle modification advised Estimated body mass index is 42.16 kg/m as calculated from the following:   Height as of this encounter: 5\' 3"  (1.6 m).   Weight as of this encounter: 108 kg.      Code Status: Full  Family Communication: None at bedside  Disposition Plan: Status is: Inpatient  The patient will require care spanning > 2 midnights and should be moved to inpatient because: Inpatient level of care appropriate due to severity of illness  Dispo: The patient is from: Home              Anticipated d/c is to: Home              Patient currently is not medically stable to d/c.   Difficult to place patient No   Consultants:  None  Procedures: None  Antimicrobials:  Ceftriaxone  DVT prophylaxis:  lovenox   Objective: Vitals:   01/27/21 1800 01/27/21 2005 01/28/21 0503 01/28/21 0932  BP: (!) 147/97 138/85 134/82 (!) 146/91  Pulse: 86 75 81 85  Resp: 18 18 18 20   Temp: 98.5 F (36.9 C) 99.1 F (37.3 C) 98.1 F (36.7 C) 97.9 F (36.6 C)  TempSrc: Oral Oral Oral Oral  SpO2: 100% 97% 96% 97%  Weight:      Height:        Intake/Output Summary (Last 24 hours) at 01/28/2021 1613 Last data filed at 01/28/2021 1400 Gross per 24 hour  Intake 1300.06 ml  Output 0 ml  Net 1300.06 ml   Filed Weights   01/25/21 1741  Weight: 108 kg    Exam:  General: NAD   Cardiovascular: S1,  S2 present  Respiratory: CTAB  Abdomen: Soft, mild bilateral flank tenderness, obese, bowel sounds present  Musculoskeletal: No bilateral pedal edema noted  Skin: Normal, multiple tattoos noted   Psychiatry: Normal mood   Data Reviewed: CBC: Recent Labs  Lab 01/25/21 1909 01/26/21 0630 01/27/21 0332 01/28/21 0427  WBC 26.1* 20.7* 19.0* 14.6*  NEUTROABS 20.2* 16.8* 14.6* 10.1*  HGB 13.2  12.7 12.8 12.3  HCT 38.6 38.3 39.0 36.3  MCV 86.9 88.5 88.0 85.6  PLT 230 176 189 962   Basic Metabolic Panel: Recent Labs  Lab 01/25/21 1909 01/26/21 0630 01/27/21 0332 01/28/21 0427  NA 135 139 138 137  K 2.9* 3.6 3.6 3.3*  CL 101 108 108 108  CO2 23 24 22 23   GLUCOSE 114* 103* 80 95  BUN 13 8 7 8   CREATININE 1.38* 1.40* 1.39* 1.35*  CALCIUM 9.3 9.0 9.0 9.2  MG  --  2.0  --   --    GFR: Estimated Creatinine Clearance: 67.2 mL/min (A) (by C-G formula based on SCr of 1.35 mg/dL (H)). Liver Function Tests: Recent Labs  Lab 01/25/21 1909 01/26/21 0630 01/27/21 0332 01/28/21 0427  AST 22 19 17 17   ALT 25 21 19 18   ALKPHOS 133* 125 132* 127*  BILITOT 1.3* 1.3* 1.2 0.7  PROT 7.6 6.1* 6.2* 6.0*  ALBUMIN 3.1* 2.5* 2.2* 2.0*   No results for input(s): LIPASE, AMYLASE in the last 168 hours. No results for input(s): AMMONIA in the last 168 hours. Coagulation Profile: No results for input(s): INR, PROTIME in the last 168 hours. Cardiac Enzymes: No results for input(s): CKTOTAL, CKMB, CKMBINDEX, TROPONINI in the last 168 hours. BNP (last 3 results) No results for input(s): PROBNP in the last 8760 hours. HbA1C: No results for input(s): HGBA1C in the last 72 hours. CBG: No results for input(s): GLUCAP in the last 168 hours. Lipid Profile: No results for input(s): CHOL, HDL, LDLCALC, TRIG, CHOLHDL, LDLDIRECT in the last 72 hours. Thyroid Function Tests: Recent Labs    01/26/21 0630  TSH 1.966   Anemia Panel: No results for input(s): VITAMINB12, FOLATE, FERRITIN, TIBC, IRON, RETICCTPCT in the last 72 hours. Urine analysis:    Component Value Date/Time   COLORURINE YELLOW 01/25/2021 1909   APPEARANCEUR CLOUDY (A) 01/25/2021 1909   LABSPEC <1.005 (L) 01/25/2021 1909   PHURINE 6.5 01/25/2021 1909   GLUCOSEU NEGATIVE 01/25/2021 1909   HGBUR LARGE (A) 01/25/2021 1909   BILIRUBINUR NEGATIVE 01/25/2021 [place] NEGATIVE 01/25/2021 1909   PROTEINUR 30 (A)  01/25/2021 1909   NITRITE NEGATIVE 01/25/2021 1909   LEUKOCYTESUR LARGE (A) 01/25/2021 1909   Sepsis Labs: @LABRCNTIP (procalcitonin:4,lacticidven:4)  ) Recent Results (from the past 240 hour(s))  Urine culture     Status: Abnormal   Collection Time: 01/25/21  7:09 PM   Specimen: In/Out Cath Urine  Result Value Ref Range Status   Specimen Description   Final    IN/OUT CATH URINE Performed at [facility], [address] [place], [place] [address]    Special Requests   Final    NONE Performed at [facility], [address] [place], [place] [address]    Culture MULTIPLE SPECIES PRESENT, SUGGEST RECOLLECTION (A)  Final   Report Status 01/27/2021 FINAL  Final  Resp Panel by RT-PCR (Flu A&B, Covid) Nasopharyngeal Swab     Status: None   Collection Time: 01/25/21  7:09 PM   Specimen: Nasopharyngeal Swab; Nasopharyngeal(NP) swabs in vial  transport medium  Result Value Ref Range Status   SARS Coronavirus 2 by RT PCR NEGATIVE NEGATIVE Final    Comment: (NOTE) SARS-CoV-2 target nucleic acids are NOT DETECTED.  The SARS-CoV-2 RNA is generally detectable in upper respiratory specimens during the acute phase of infection. The lowest concentration of SARS-CoV-2 viral copies this assay can detect is 138 copies/mL. A negative result does not preclude SARS-Cov-2 infection and should not be used as the sole basis for treatment or other patient management decisions. A negative result may occur with  improper specimen collection/handling, submission of specimen other than nasopharyngeal swab, presence of viral mutation(s) within the areas targeted by this assay, and inadequate number of viral copies(<138 copies/mL). A negative result must be combined with clinical observations, patient history, and epidemiological information. The expected result is Negative.  Fact Sheet for Patients:  EntrepreneurPulse.com.au  Fact Sheet for Healthcare Providers:   IncredibleEmployment.be  This test is no t yet approved or cleared by the [place] FDA and  has been authorized for detection and/or diagnosis of SARS-CoV-2 by FDA under an Emergency Use Authorization (EUA). This EUA will remain  in effect (meaning this test can be used) for the duration of the COVID-19 declaration under Section 564(b)(1) of the Act, 21 U.S.C.section 360bbb-3(b)(1), unless the authorization is terminated  or revoked sooner.       Influenza A by PCR NEGATIVE NEGATIVE Final   Influenza B by PCR NEGATIVE NEGATIVE Final    Comment: (NOTE) The Xpert Xpress SARS-CoV-2/FLU/RSV plus assay is intended as an aid in the diagnosis of influenza from Nasopharyngeal swab specimens and should not be used as a sole basis for treatment. Nasal washings and aspirates are unacceptable for Xpert Xpress SARS-CoV-2/FLU/RSV testing.  Fact Sheet for Patients: EntrepreneurPulse.com.au  Fact Sheet for Healthcare Providers: IncredibleEmployment.be  This test is not yet approved or cleared by the [place] FDA and has been authorized for detection and/or diagnosis of SARS-CoV-2 by FDA under an Emergency Use Authorization (EUA). This EUA will remain in effect (meaning this test can be used) for the duration of the COVID-19 declaration under Section 564(b)(1) of the Act, 21 U.S.C. section 360bbb-3(b)(1), unless the authorization is terminated or revoked.  Performed at [facility], [address] [place], [place] [address]   Blood culture (routine single)     Status: Abnormal   Collection Time: 01/25/21  7:10 PM   Specimen: BLOOD  Result Value Ref Range Status   Specimen Description   Final    BLOOD RIGHT ANTECUBITAL Performed at [place] [facility], [address] [place], [place] [address]    Special Requests   Final    BOTTLES DRAWN AEROBIC AND ANAEROBIC Blood Culture adequate volume Performed at [facility], [address] [place], [place] [address]    Culture  Setup Time   Final    GRAM NEGATIVE RODS AEROBIC BOTTLE ONLY CRITICAL RESULT CALLED TO, READ BACK BY AND VERIFIED WITHJonetta Speak PHARMD 1410 01/26/21 A BROWNING    Culture (A)  Final    ESCHERICHIA COLI SUSCEPTIBILITIES PERFORMED ON PREVIOUS CULTURE WITHIN THE LAST 5 DAYS. Performed at [facility], [place] [address], [place], [place] [address]    Report Status 01/28/2021 FINAL  Final  Culture, blood (single)     Status: Abnormal   Collection Time: 01/25/21  9:01 PM   Specimen: BLOOD  Result Value Ref Range Status   Specimen Description   Final  BLOOD BLOOD RIGHT WRIST Performed at [facility], [address] [place], [place] [address]    Special Requests   Final    BOTTLES DRAWN AEROBIC AND ANAEROBIC Blood Culture adequate volume Performed at [facility], [address] [place], [place] [address]    Culture  Setup Time   Final    GRAM NEGATIVE RODS IN BOTH AEROBIC AND ANAEROBIC BOTTLES CRITICAL RESULT CALLED TO, READ BACK BY AND VERIFIED WITHJonetta Speak [facility] [id number] 01/26/21 A BROWNING Performed at [facility], [place] [address] [place] [address], [place], [place] [address]    Culture ESCHERICHIA COLI (A)  Final   Report Status 01/28/2021 FINAL  Final   Organism ID, Bacteria ESCHERICHIA COLI  Final      Susceptibility   Escherichia coli - MIC*    AMPICILLIN >=32 RESISTANT Resistant     CEFAZOLIN <=4 SENSITIVE Sensitive     CEFEPIME <=0.12 SENSITIVE Sensitive     CEFTAZIDIME <=1 SENSITIVE Sensitive     CEFTRIAXONE <=0.25 SENSITIVE Sensitive     CIPROFLOXACIN <=0.25 SENSITIVE Sensitive     GENTAMICIN <=1 SENSITIVE Sensitive     IMIPENEM <=0.25 SENSITIVE Sensitive     TRIMETH/SULFA <=20 SENSITIVE Sensitive     AMPICILLIN/SULBACTAM >=32 RESISTANT Resistant     PIP/TAZO <=4 SENSITIVE Sensitive     * ESCHERICHIA COLI  Blood Culture ID Panel (Reflexed)     Status: Abnormal   Collection  Time: 01/25/21  9:01 PM  Result Value Ref Range Status   Enterococcus faecalis NOT DETECTED NOT DETECTED Final   Enterococcus Faecium NOT DETECTED NOT DETECTED Final   Listeria monocytogenes NOT DETECTED NOT DETECTED Final   Staphylococcus species NOT DETECTED NOT DETECTED Final   Staphylococcus aureus (BCID) NOT DETECTED NOT DETECTED Final   Staphylococcus epidermidis NOT DETECTED NOT DETECTED Final   Staphylococcus lugdunensis NOT DETECTED NOT DETECTED Final   Streptococcus species NOT DETECTED NOT DETECTED Final   Streptococcus agalactiae NOT DETECTED NOT DETECTED Final   Streptococcus pneumoniae NOT DETECTED NOT DETECTED Final   Streptococcus pyogenes NOT DETECTED NOT DETECTED Final   A.calcoaceticus-baumannii NOT DETECTED NOT DETECTED Final   Bacteroides fragilis NOT DETECTED NOT DETECTED Final   Enterobacterales DETECTED (A) NOT DETECTED Final    Comment: Enterobacterales represent a large order of gram negative bacteria, not a single organism. CRITICAL RESULT CALLED TO, READ BACK BY AND VERIFIED WITH: Jonetta Speak [id number] 01/26/21 A BROWNING    Enterobacter cloacae complex NOT DETECTED NOT DETECTED Final   Escherichia coli DETECTED (A) NOT DETECTED Final    Comment: CRITICAL RESULT CALLED TO, READ BACK BY AND VERIFIED WITHJonetta Speak PHARMD 1410 01/26/21 A BROWNING    Klebsiella aerogenes NOT DETECTED NOT DETECTED Final   Klebsiella oxytoca NOT DETECTED NOT DETECTED Final   Klebsiella pneumoniae NOT DETECTED NOT DETECTED Final   Proteus species NOT DETECTED NOT DETECTED Final   Salmonella species NOT DETECTED NOT DETECTED Final   Serratia marcescens NOT DETECTED NOT DETECTED Final   Haemophilus influenzae NOT DETECTED NOT DETECTED Final   Neisseria meningitidis NOT DETECTED NOT DETECTED Final   Pseudomonas aeruginosa NOT DETECTED NOT DETECTED Final   Stenotrophomonas maltophilia NOT DETECTED NOT DETECTED Final   Candida albicans NOT DETECTED NOT DETECTED Final   Candida auris NOT  DETECTED NOT DETECTED Final   Candida glabrata NOT DETECTED NOT DETECTED Final   Candida krusei NOT DETECTED NOT DETECTED Final   Candida parapsilosis NOT DETECTED NOT DETECTED Final  Candida tropicalis NOT DETECTED NOT DETECTED Final   Cryptococcus neoformans/gattii NOT DETECTED NOT DETECTED Final   CTX-M ESBL NOT DETECTED NOT DETECTED Final   Carbapenem resistance IMP NOT DETECTED NOT DETECTED Final   Carbapenem resistance KPC NOT DETECTED NOT DETECTED Final   Carbapenem resistance NDM NOT DETECTED NOT DETECTED Final   Carbapenem resist OXA 48 LIKE NOT DETECTED NOT DETECTED Final   Carbapenem resistance VIM NOT DETECTED NOT DETECTED Final    Comment: Performed at [facility], [place] [address] [place], [place] [address]      Studies: No results found.  Scheduled Meds: . diclofenac Sodium  2 g Topical QID  . [START ON 01/29/2021] DULoxetine  60 mg Oral Daily  . enoxaparin (LOVENOX) injection  55 mg Subcutaneous Q24H  . lidocaine  1 patch Transdermal Q24H  . polyethylene glycol  17 g Oral BID  . QUEtiapine  150 mg Oral QHS  . senna-docusate  1 tablet Oral BID    Continuous Infusions: . cefTRIAXone (ROCEPHIN)  IV 2 g (01/27/21 1945)     LOS: 1 day     Alma Friendly, MD [facility]  If 7PM-7AM, please contact night-coverage [email or web address] 01/28/2021, 4:13 PM

## 2021-01-28 NOTE — Plan of Care (Signed)
  Problem: Education: Goal: Knowledge of General Education information will improve Description: Including pain rating scale, medication(s)/side effects and non-pharmacologic comfort measures Outcome: Completed/Met   Problem: Health Behavior/Discharge Planning: Goal: Ability to manage health-related needs will improve Outcome: Completed/Met   Problem: Clinical Measurements: Goal: Diagnostic test results will improve Outcome: Completed/Met   Problem: Activity: Goal: Risk for activity intolerance will decrease Outcome: Completed/Met   Problem: Nutrition: Goal: Adequate nutrition will be maintained Outcome: Completed/Met   Problem: Coping: Goal: Level of anxiety will decrease Outcome: Completed/Met   Problem: Safety: Goal: Ability to remain free from injury will improve Outcome: Completed/Met   Problem: Skin Integrity: Goal: Risk for impaired skin integrity will decrease Outcome: Completed/Met   Problem: Fluid Volume: Goal: Hemodynamic stability will improve Outcome: Completed/Met   Problem: Respiratory: Goal: Ability to maintain adequate ventilation will improve Outcome: Completed/Met

## 2021-01-28 NOTE — Plan of Care (Signed)
  Problem: Clinical Measurements: Goal: Ability to maintain clinical measurements within normal limits will improve Outcome: Completed/Met Goal: Respiratory complications will improve Outcome: Completed/Met Goal: Cardiovascular complication will be avoided Outcome: Completed/Met   Problem: Elimination: Goal: Will not experience complications related to bowel motility Outcome: Completed/Met Goal: Will not experience complications related to urinary retention Outcome: Completed/Met   Problem: Pain Managment: Goal: General experience of comfort will improve Outcome: Completed/Met   Problem: Clinical Measurements: Goal: Diagnostic test results will improve Outcome: Completed/Met Goal: Signs and symptoms of infection will decrease Outcome: Completed/Met

## 2021-01-29 LAB — CBC WITH DIFFERENTIAL/PLATELET
Abs Immature Granulocytes: 0.52 10*3/uL — ABNORMAL HIGH (ref 0.00–0.07)
Basophils Absolute: 0.1 10*3/uL (ref 0.0–0.1)
Basophils Relative: 1 %
Eosinophils Absolute: 0.2 10*3/uL (ref 0.0–0.5)
Eosinophils Relative: 2 %
HCT: 36.2 % (ref 36.0–46.0)
Hemoglobin: 12.2 g/dL (ref 12.0–15.0)
Immature Granulocytes: 4 %
Lymphocytes Relative: 25 %
Lymphs Abs: 3.2 10*3/uL (ref 0.7–4.0)
MCH: 28.8 pg (ref 26.0–34.0)
MCHC: 33.7 g/dL (ref 30.0–36.0)
MCV: 85.6 fL (ref 80.0–100.0)
Monocytes Absolute: 1.4 10*3/uL — ABNORMAL HIGH (ref 0.1–1.0)
Monocytes Relative: 12 %
Neutro Abs: 7 10*3/uL (ref 1.7–7.7)
Neutrophils Relative %: 56 %
Platelets: 270 10*3/uL (ref 150–400)
RBC: 4.23 MIL/uL (ref 3.87–5.11)
RDW: 14.6 % (ref 11.5–15.5)
WBC: 12.5 10*3/uL — ABNORMAL HIGH (ref 4.0–10.5)
nRBC: 0 % (ref 0.0–0.2)

## 2021-01-29 LAB — COMPREHENSIVE METABOLIC PANEL
ALT: 22 U/L (ref 0–44)
AST: 19 U/L (ref 15–41)
Albumin: 2 g/dL — ABNORMAL LOW (ref 3.5–5.0)
Alkaline Phosphatase: 118 U/L (ref 38–126)
Anion gap: 7 (ref 5–15)
BUN: 7 mg/dL (ref 6–20)
CO2: 24 mmol/L (ref 22–32)
Calcium: 9.3 mg/dL (ref 8.9–10.3)
Chloride: 107 mmol/L (ref 98–111)
Creatinine, Ser: 1.29 mg/dL — ABNORMAL HIGH (ref 0.44–1.00)
GFR, Estimated: 55 mL/min — ABNORMAL LOW (ref >60)
Glucose, Bld: 93 mg/dL (ref 70–99)
Potassium: 3.7 mmol/L (ref 3.5–5.1)
Sodium: 138 mmol/L (ref 135–145)
Total Bilirubin: 0.7 mg/dL (ref 0.3–1.2)
Total Protein: 6.2 g/dL — ABNORMAL LOW (ref 6.5–8.1)

## 2021-01-29 MED ORDER — CEPHALEXIN 500 MG PO CAPS
1000.0000 mg | ORAL_CAPSULE | Freq: Three times a day (TID) | ORAL | Status: DC
  Administered 2021-01-30: 1000 mg via ORAL
  Filled 2021-01-29: qty 2

## 2021-01-29 MED ORDER — ONDANSETRON HCL 4 MG/2ML IJ SOLN
4.0000 mg | Freq: Three times a day (TID) | INTRAMUSCULAR | Status: DC | PRN
  Administered 2021-01-29: 4 mg via INTRAVENOUS
  Filled 2021-01-29: qty 2

## 2021-01-29 NOTE — Progress Notes (Signed)
PROGRESS NOTE  Cynthia Reese PXT:[id number] DOB: [birth year] DOA: 01/25/2021 PCP: Karie Mainland, FNP  HPI/Recap of past 24 hours: HPI from Dr Patrick Jupiter is a 39 y.o. female with history of left shoulder pain status post surgery last year, history of asthma, depression has been experiencing bilateral flank pain for the last 48 hours gradually worsened, associated with multiple episodes of nausea/vomiting, denies any associated dysuria or any vaginal discharges. In the ED, pt was tachycardic, febrile with temperature of 100.3 F.  Labs significant for leukocytosis of 26,000, potassium of 2.9 and creatinine 1.3.  UA is concerning for UTI with many bacteria large number of WBCs leukocyte esterase positive but squamous cells also positive.  CT abdomen pelvis done shows bilateral perinephric stranding concerning for pyelonephritis.  CT lumbar spine was not showing anything acute. COVID test was negative chest x-ray unremarkable. EKG shows sinus tachycardia. Blood cultures were obtained and started on empiric antibiotics for pyelonephritis and admitted for further management.       Today, patient complains of some nausea, but denies any vomiting. Bilateral flank pain is improving.    Assessment/Plan: Principal Problem:   Sepsis without acute organ dysfunction (HCC) Active Problems:   Acute pyelonephritis   Hypokalemia   Sepsis ([place])   Sepsis likely 2/2 acute pyelonephritis E. coli bacteremia On admission, pt was tachycardic, leukocytosis Currently afebrile, with leukocytosis UA showed large leukocytes, many bacteria, >50 WBC UC with multiple species BC growing E. Coli, repeat BC X2 pending CT abd/pelvis showed bilateral perinephric stranding concerning for pyelonephritis IV Ceftriaxone, switch to PO keflex upon discharge D/C IVF, pain management Monitor closely  ?AKI Likely 2/2 above Encourage oral intake Daily BMP  Hypokalemia Replace as needed  History of  asthma Stable Use Singulair, inhalers/DuoNebs as needed  Chronic left shoulder pain Continue PTA pain regimen  History of depression Continue Paxil, remeron  Morbid obesity Lifestyle modification advised Estimated body mass index is 42.16 kg/m as calculated from the following:   Height as of this encounter: 5\' 3"  (1.6 m).   Weight as of this encounter: 108 kg.      Code Status: Full  Family Communication: None at bedside  Disposition Plan: Status is: Inpatient  The patient will require care spanning > 2 midnights and should be moved to inpatient because: Inpatient level of care appropriate due to severity of illness  Dispo: The patient is from: Home              Anticipated d/c is to: Home              Patient currently is not medically stable to d/c.   Difficult to place patient No   Consultants:  None  Procedures: None  Antimicrobials:  Ceftriaxone  DVT prophylaxis:  lovenox   Objective: Vitals:   01/28/21 0932 01/28/21 1953 01/29/21 0512 01/29/21 0952  BP: (!) 146/91 (!) 142/98 140/82 (!) 144/105  Pulse: 85 74 67 82  Resp: 20 17 20 17   Temp: 97.9 F (36.6 C) 98.7 F (37.1 C) 98.4 F (36.9 C) 98.5 F (36.9 C)  TempSrc: Oral  Oral Oral  SpO2: 97% 97% 100% 99%  Weight:      Height:        Intake/Output Summary (Last 24 hours) at 01/29/2021 1702 Last data filed at 01/29/2021 1644 Gross per 24 hour  Intake 1647.82 ml  Output 0 ml  Net 1647.82 ml   Filed Weights   01/25/21 1741  Weight: 108 kg  Exam:  General: NAD   Cardiovascular: S1, S2 present  Respiratory: CTAB  Abdomen: Soft, NT, obese, bowel sounds present  Musculoskeletal: No bilateral pedal edema noted  Skin: Normal, multiple tattoos noted   Psychiatry: Normal mood   Data Reviewed: CBC: Recent Labs  Lab 01/25/21 1909 01/26/21 0630 01/27/21 0332 01/28/21 0427 01/29/21 0500  WBC 26.1* 20.7* 19.0* 14.6* 12.5*  NEUTROABS 20.2* 16.8* 14.6* 10.1* 7.0  HGB 13.2  12.7 12.8 12.3 12.2  HCT 38.6 38.3 39.0 36.3 36.2  MCV 86.9 88.5 88.0 85.6 85.6  PLT 230 176 189 244 [id number]   Basic Metabolic Panel: Recent Labs  Lab 01/25/21 1909 01/26/21 0630 01/27/21 0332 01/28/21 0427 01/29/21 0500  NA 135 139 138 137 138  K 2.9* 3.6 3.6 3.3* 3.7  CL 101 108 108 108 107  CO2 23 24 22 23 24   GLUCOSE 114* 103* 80 95 93  BUN 13 8 7 8 7   CREATININE 1.38* 1.40* 1.39* 1.35* 1.29*  CALCIUM 9.3 9.0 9.0 9.2 9.3  MG  --  2.0  --   --   --    GFR: Estimated Creatinine Clearance: 70.3 mL/min (A) (by C-G formula based on SCr of 1.29 mg/dL (H)). Liver Function Tests: Recent Labs  Lab 01/25/21 1909 01/26/21 0630 01/27/21 0332 01/28/21 0427 01/29/21 0500  AST 22 19 17 17 19   ALT 25 21 19 18 22   ALKPHOS 133* 125 132* 127* 118  BILITOT 1.3* 1.3* 1.2 0.7 0.7  PROT 7.6 6.1* 6.2* 6.0* 6.2*  ALBUMIN 3.1* 2.5* 2.2* 2.0* 2.0*   No results for input(s): LIPASE, AMYLASE in the last 168 hours. No results for input(s): AMMONIA in the last 168 hours. Coagulation Profile: No results for input(s): INR, PROTIME in the last 168 hours. Cardiac Enzymes: No results for input(s): CKTOTAL, CKMB, CKMBINDEX, TROPONINI in the last 168 hours. BNP (last 3 results) No results for input(s): PROBNP in the last 8760 hours. HbA1C: No results for input(s): HGBA1C in the last 72 hours. CBG: No results for input(s): GLUCAP in the last 168 hours. Lipid Profile: No results for input(s): CHOL, HDL, LDLCALC, TRIG, CHOLHDL, LDLDIRECT in the last 72 hours. Thyroid Function Tests: No results for input(s): TSH, T4TOTAL, FREET4, T3FREE, THYROIDAB in the last 72 hours. Anemia Panel: No results for input(s): VITAMINB12, FOLATE, FERRITIN, TIBC, IRON, RETICCTPCT in the last 72 hours. Urine analysis:    Component Value Date/Time   COLORURINE YELLOW 01/25/2021 1909   APPEARANCEUR CLOUDY (A) 01/25/2021 1909   LABSPEC <1.005 (L) 01/25/2021 1909   PHURINE 6.5 01/25/2021 1909   GLUCOSEU NEGATIVE  01/25/2021 1909   HGBUR LARGE (A) 01/25/2021 1909   BILIRUBINUR NEGATIVE 01/25/2021 [place] NEGATIVE 01/25/2021 1909   PROTEINUR 30 (A) 01/25/2021 1909   NITRITE NEGATIVE 01/25/2021 1909   LEUKOCYTESUR LARGE (A) 01/25/2021 1909   Sepsis Labs: @LABRCNTIP (procalcitonin:4,lacticidven:4)  ) Recent Results (from the past 240 hour(s))  Urine culture     Status: Abnormal   Collection Time: 01/25/21  7:09 PM   Specimen: In/Out Cath Urine  Result Value Ref Range Status   Specimen Description   Final    IN/OUT CATH URINE Performed at [facility], [address] [place], [place] [address]    Special Requests   Final    NONE Performed at [facility], [address] [place], [place] [address]    Culture MULTIPLE SPECIES PRESENT, SUGGEST RECOLLECTION (A)  Final   Report Status 01/27/2021  FINAL  Final  Resp Panel by RT-PCR (Flu A&B, Covid) Nasopharyngeal Swab     Status: None   Collection Time: 01/25/21  7:09 PM   Specimen: Nasopharyngeal Swab; Nasopharyngeal(NP) swabs in vial transport medium  Result Value Ref Range Status   SARS Coronavirus 2 by RT PCR NEGATIVE NEGATIVE Final    Comment: (NOTE) SARS-CoV-2 target nucleic acids are NOT DETECTED.  The SARS-CoV-2 RNA is generally detectable in upper respiratory specimens during the acute phase of infection. The lowest concentration of SARS-CoV-2 viral copies this assay can detect is 138 copies/mL. A negative result does not preclude SARS-Cov-2 infection and should not be used as the sole basis for treatment or other patient management decisions. A negative result may occur with  improper specimen collection/handling, submission of specimen other than nasopharyngeal swab, presence of viral mutation(s) within the areas targeted by this assay, and inadequate number of viral copies(<138 copies/mL). A negative result must be combined with clinical observations, patient history, and  epidemiological information. The expected result is Negative.  Fact Sheet for Patients:  EntrepreneurPulse.com.au  Fact Sheet for Healthcare Providers:  IncredibleEmployment.be  This test is no t yet approved or cleared by the [place] FDA and  has been authorized for detection and/or diagnosis of SARS-CoV-2 by FDA under an Emergency Use Authorization (EUA). This EUA will remain  in effect (meaning this test can be used) for the duration of the COVID-19 declaration under Section 564(b)(1) of the Act, 21 U.S.C.section 360bbb-3(b)(1), unless the authorization is terminated  or revoked sooner.       Influenza A by PCR NEGATIVE NEGATIVE Final   Influenza B by PCR NEGATIVE NEGATIVE Final    Comment: (NOTE) The Xpert Xpress SARS-CoV-2/FLU/RSV plus assay is intended as an aid in the diagnosis of influenza from Nasopharyngeal swab specimens and should not be used as a sole basis for treatment. Nasal washings and aspirates are unacceptable for Xpert Xpress SARS-CoV-2/FLU/RSV testing.  Fact Sheet for Patients: EntrepreneurPulse.com.au  Fact Sheet for Healthcare Providers: IncredibleEmployment.be  This test is not yet approved or cleared by the [place] FDA and has been authorized for detection and/or diagnosis of SARS-CoV-2 by FDA under an Emergency Use Authorization (EUA). This EUA will remain in effect (meaning this test can be used) for the duration of the COVID-19 declaration under Section 564(b)(1) of the Act, 21 U.S.C. section 360bbb-3(b)(1), unless the authorization is terminated or revoked.  Performed at [facility], [address] [place], [place] [address]   Blood culture (routine single)     Status: Abnormal   Collection Time: 01/25/21  7:10 PM   Specimen: BLOOD  Result Value Ref Range Status   Specimen Description   Final    BLOOD RIGHT ANTECUBITAL Performed at [facility], [address] [place], [place] [address]    Special Requests   Final    BOTTLES DRAWN AEROBIC AND ANAEROBIC Blood Culture adequate volume Performed at [facility], [address] [place], [place] [address]    Culture  Setup Time   Final    GRAM NEGATIVE RODS AEROBIC BOTTLE ONLY CRITICAL RESULT CALLED TO, READ BACK BY AND VERIFIED WITHJonetta Speak PHARMD 1410 01/26/21 A BROWNING    Culture (A)  Final    ESCHERICHIA COLI SUSCEPTIBILITIES PERFORMED ON PREVIOUS CULTURE WITHIN THE LAST 5 DAYS. Performed at [facility], [place] [address], [place], [place] [address]    Report Status 01/28/2021 FINAL  Final  Culture, blood (single)     Status: Abnormal   Collection Time: 01/25/21  9:01 PM   Specimen: BLOOD  Result Value Ref Range Status   Specimen Description   Final    BLOOD BLOOD RIGHT WRIST Performed at [facility], [address] [place], [place] [address]    Special Requests   Final    BOTTLES DRAWN AEROBIC AND ANAEROBIC Blood Culture adequate volume Performed at [facility], [address] [place], [place] [address]    Culture  Setup Time   Final    GRAM NEGATIVE RODS IN BOTH AEROBIC AND ANAEROBIC BOTTLES CRITICAL RESULT CALLED TO, READ BACK BY AND VERIFIED WITHJonetta Speak PHARMD 1410 01/26/21 A BROWNING Performed at [facility], [place] [address] [place], [place] [address]    Culture ESCHERICHIA COLI (A)  Final   Report Status 01/28/2021 FINAL  Final   Organism ID, Bacteria ESCHERICHIA COLI  Final      Susceptibility   Escherichia coli - MIC*    AMPICILLIN >=32 RESISTANT Resistant     CEFAZOLIN <=4 SENSITIVE Sensitive     CEFEPIME <=0.12 SENSITIVE Sensitive     CEFTAZIDIME <=1 SENSITIVE Sensitive     CEFTRIAXONE <=0.25 SENSITIVE Sensitive     CIPROFLOXACIN <=0.25 SENSITIVE Sensitive     GENTAMICIN <=1 SENSITIVE Sensitive     IMIPENEM <=0.25 SENSITIVE Sensitive     TRIMETH/SULFA <=20 SENSITIVE Sensitive      AMPICILLIN/SULBACTAM >=32 RESISTANT Resistant     PIP/TAZO <=4 SENSITIVE Sensitive     * ESCHERICHIA COLI  Blood Culture ID Panel (Reflexed)     Status: Abnormal   Collection Time: 01/25/21  9:01 PM  Result Value Ref Range Status   Enterococcus faecalis NOT DETECTED NOT DETECTED Final   Enterococcus Faecium NOT DETECTED NOT DETECTED Final   Listeria monocytogenes NOT DETECTED NOT DETECTED Final   Staphylococcus species NOT DETECTED NOT DETECTED Final   Staphylococcus aureus (BCID) NOT DETECTED NOT DETECTED Final   Staphylococcus epidermidis NOT DETECTED NOT DETECTED Final   Staphylococcus lugdunensis NOT DETECTED NOT DETECTED Final   Streptococcus species NOT DETECTED NOT DETECTED Final   Streptococcus agalactiae NOT DETECTED NOT DETECTED Final   Streptococcus pneumoniae NOT DETECTED NOT DETECTED Final   Streptococcus pyogenes NOT DETECTED NOT DETECTED Final   A.calcoaceticus-baumannii NOT DETECTED NOT DETECTED Final   Bacteroides fragilis NOT DETECTED NOT DETECTED Final   Enterobacterales DETECTED (A) NOT DETECTED Final    Comment: Enterobacterales represent a large order of gram negative bacteria, not a single organism. CRITICAL RESULT CALLED TO, READ BACK BY AND VERIFIED WITH: Jonetta Speak OACZYS 0630 01/26/21 A BROWNING    Enterobacter cloacae complex NOT DETECTED NOT DETECTED Final   Escherichia coli DETECTED (A) NOT DETECTED Final    Comment: CRITICAL RESULT CALLED TO, READ BACK BY AND VERIFIED WITHJonetta Speak PHARMD 1410 01/26/21 A BROWNING    Klebsiella aerogenes NOT DETECTED NOT DETECTED Final   Klebsiella oxytoca NOT DETECTED NOT DETECTED Final   Klebsiella pneumoniae NOT DETECTED NOT DETECTED Final   Proteus species NOT DETECTED NOT DETECTED Final   Salmonella species NOT DETECTED NOT DETECTED Final   Serratia marcescens NOT DETECTED NOT DETECTED Final   Haemophilus influenzae NOT DETECTED NOT DETECTED Final   Neisseria meningitidis NOT DETECTED NOT DETECTED Final   Pseudomonas  aeruginosa NOT DETECTED NOT DETECTED Final   Stenotrophomonas maltophilia NOT DETECTED NOT DETECTED Final   Candida albicans NOT DETECTED  NOT DETECTED Final   Candida auris NOT DETECTED NOT DETECTED Final   Candida glabrata NOT DETECTED NOT DETECTED Final   Candida krusei NOT DETECTED NOT DETECTED Final   Candida parapsilosis NOT DETECTED NOT DETECTED Final   Candida tropicalis NOT DETECTED NOT DETECTED Final   Cryptococcus neoformans/gattii NOT DETECTED NOT DETECTED Final   CTX-M ESBL NOT DETECTED NOT DETECTED Final   Carbapenem resistance IMP NOT DETECTED NOT DETECTED Final   Carbapenem resistance KPC NOT DETECTED NOT DETECTED Final   Carbapenem resistance NDM NOT DETECTED NOT DETECTED Final   Carbapenem resist OXA 48 LIKE NOT DETECTED NOT DETECTED Final   Carbapenem resistance VIM NOT DETECTED NOT DETECTED Final    Comment: Performed at [facility] [address], [place], [place] [address]  Culture, blood (routine x 2)     Status: None (Preliminary result)   Collection Time: 01/29/21  7:38 AM   Specimen: BLOOD  Result Value Ref Range Status   Specimen Description BLOOD LEFT ANTECUBITAL  Final   Special Requests   Final    BOTTLES DRAWN AEROBIC AND ANAEROBIC Blood Culture adequate volume   Culture   Final    NO GROWTH < 12 HOURS Performed at [facility], [place] [address] [place], [place] [address]    Report Status PENDING  Incomplete  Culture, blood (routine x 2)     Status: None (Preliminary result)   Collection Time: 01/29/21  7:39 AM   Specimen: BLOOD  Result Value Ref Range Status   Specimen Description BLOOD LEFT ANTECUBITAL  Final   Special Requests   Final    BOTTLES DRAWN AEROBIC AND ANAEROBIC Blood Culture adequate volume   Culture   Final    NO GROWTH < 12 HOURS Performed at [facility], [place] [address], [place], [place] [address]    Report Status PENDING  Incomplete      Studies: No results found.  Scheduled Meds: . [START ON 01/30/2021]  cephALEXin  1,000 mg Oral Q8H  . diclofenac Sodium  2 g Topical QID  . DULoxetine  60 mg Oral Daily  . enoxaparin (LOVENOX) injection  55 mg Subcutaneous Q24H  . lidocaine  1 patch Transdermal Q24H  . polyethylene glycol  17 g Oral BID  . QUEtiapine  150 mg Oral QHS  . senna-docusate  1 tablet Oral BID    Continuous Infusions: . cefTRIAXone (ROCEPHIN)  IV 2 g (01/28/21 2112)     LOS: 2 days     Alma Friendly, MD [facility]  If 7PM-7AM, please contact night-coverage [email or web address] 01/29/2021, 5:02 PM

## 2021-01-29 NOTE — Progress Notes (Signed)
Ok to transition to PO keflex starting 5/17 to complete a total of 10d. We will use a little higher PO dose due to her wt. Per Dr Horris Latino.  Keflex 1g PO TID x6 days  Onnie Boer, PharmD, BCIDP, AAHIVP, CPP Infectious Disease Pharmacist 01/29/2021 10:14 AM

## 2021-01-30 DIAGNOSIS — E876 Hypokalemia: Secondary | ICD-10-CM

## 2021-01-30 LAB — CBC WITH DIFFERENTIAL/PLATELET
Abs Immature Granulocytes: 0.5 10*3/uL — ABNORMAL HIGH (ref 0.00–0.07)
Basophils Absolute: 0.1 10*3/uL (ref 0.0–0.1)
Basophils Relative: 1 %
Eosinophils Absolute: 0.3 10*3/uL (ref 0.0–0.5)
Eosinophils Relative: 2 %
HCT: 35.9 % — ABNORMAL LOW (ref 36.0–46.0)
Hemoglobin: 12.1 g/dL (ref 12.0–15.0)
Immature Granulocytes: 5 %
Lymphocytes Relative: 29 %
Lymphs Abs: 3.1 10*3/uL (ref 0.7–4.0)
MCH: 29.3 pg (ref 26.0–34.0)
MCHC: 33.7 g/dL (ref 30.0–36.0)
MCV: 86.9 fL (ref 80.0–100.0)
Monocytes Absolute: 0.9 10*3/uL (ref 0.1–1.0)
Monocytes Relative: 8 %
Neutro Abs: 5.9 10*3/uL (ref 1.7–7.7)
Neutrophils Relative %: 55 %
Platelets: 310 10*3/uL (ref 150–400)
RBC: 4.13 MIL/uL (ref 3.87–5.11)
RDW: 14.7 % (ref 11.5–15.5)
WBC: 10.7 10*3/uL — ABNORMAL HIGH (ref 4.0–10.5)
nRBC: 0 % (ref 0.0–0.2)

## 2021-01-30 LAB — COMPREHENSIVE METABOLIC PANEL
ALT: 30 U/L (ref 0–44)
AST: 26 U/L (ref 15–41)
Albumin: 2.2 g/dL — ABNORMAL LOW (ref 3.5–5.0)
Alkaline Phosphatase: 109 U/L (ref 38–126)
Anion gap: 9 (ref 5–15)
BUN: <5 mg/dL — ABNORMAL LOW (ref 6–20)
CO2: 24 mmol/L (ref 22–32)
Calcium: 9.6 mg/dL (ref 8.9–10.3)
Chloride: 106 mmol/L (ref 98–111)
Creatinine, Ser: 1.33 mg/dL — ABNORMAL HIGH (ref 0.44–1.00)
GFR, Estimated: 53 mL/min — ABNORMAL LOW (ref >60)
Glucose, Bld: 90 mg/dL (ref 70–99)
Potassium: 4.1 mmol/L (ref 3.5–5.1)
Sodium: 139 mmol/L (ref 135–145)
Total Bilirubin: 0.5 mg/dL (ref 0.3–1.2)
Total Protein: 6.5 g/dL (ref 6.5–8.1)

## 2021-01-30 MED ORDER — POLYETHYLENE GLYCOL 3350 17 G PO PACK: 17.0000 g | PACK | Freq: Every day | ORAL | 0 refills | Status: AC | PRN

## 2021-01-30 MED ORDER — CEPHALEXIN 500 MG PO CAPS: 1000.0000 mg | ORAL_CAPSULE | Freq: Three times a day (TID) | ORAL | 0 refills | Status: AC

## 2021-01-30 NOTE — Progress Notes (Signed)
Cynthia Reese to be discharged Home per MD order. Discussed prescriptions and follow up appointments with the patient. Prescriptions given to patient; medication list explained in detail. Patient verbalized understanding.  Skin clean, dry and intact without evidence of skin break down, no evidence of skin tears noted. IV catheter discontinued intact. Site without signs and symptoms of complications. Dressing and pressure applied. Pt denies pain at the site currently. No complaints noted.  Patient free of lines, drains, and wounds.   An After Visit Summary (AVS) was printed and given to the patient. Patient escorted via wheelchair, and discharged home via private auto.  Dolores Hoose, RN

## 2021-01-30 NOTE — Discharge Summary (Signed)
Discharge Summary  [facility] [id number] DOB: 15-Jul-1981  PCP: Karie Mainland, FNP  Admit date: 01/25/2021 Discharge date: 01/30/2021  Time spent: 40 mins  Recommendations for Outpatient Follow-up:  1. PCP in 1 week with repeat labs  Discharge Diagnoses:  Active Hospital Problems   Diagnosis Date Noted  . Sepsis without acute organ dysfunction ([place]) 01/26/2021  . Hypokalemia 01/26/2021  . Sepsis ([place]) 01/26/2021  . Acute pyelonephritis 01/25/2021    Resolved Hospital Problems  No resolved problems to display.    Discharge Condition: Stable  Diet recommendation: Heart healthy  Vitals:   01/30/21 0524 01/30/21 0951  BP: (!) 144/92 (!) 145/99  Pulse: 60 66  Resp: 19 17  Temp: 97.9 F (36.6 C) 98.1 F (36.7 C)  SpO2: 92% 97%    History of present illness:  Cynthia Houstonis a 40 y.o.femalewith history of left shoulder pain status post surgery last year, history of asthma, depression has been experiencing bilateral flank pain for the last 48 hours gradually worsened, associated with multiple episodes of nausea/vomiting, denies any associated dysuria or any vaginal discharges. In the ED, pt was tachycardic, febrile with temperature of 100.3 F. Labs significant for leukocytosis of 26,000, potassium of 2.9 and creatinine 1.3. UA is concerning for UTI with many bacteria large number of WBCs leukocyte esterase positive but squamous cells also positive. CT abdomen pelvis done shows bilateral perinephric stranding concerning for pyelonephritis. CT lumbar spine was not showing anything acute. COVID test was negative chest x-ray unremarkable. EKG shows sinus tachycardia. Blood cultures were obtained and started on empiric antibiotics for pyelonephritis and admitted for further management.    Today, patient denies any new complaints, reports bilateral flank pain is resolved, denies any nausea/vomiting, fever/chills, chest pain, shortness of breath, cough.  Advised to  follow-up with PCP in 1 week with repeat labs.     Hospital Course:  Principal Problem:   Sepsis without acute organ dysfunction (HCC) Active Problems:   Acute pyelonephritis   Hypokalemia   Sepsis ([place])  Sepsis likely 2/2 acute pyelonephritis E. coli bacteremia On admission, pt was tachycardic, leukocytosis Currently afebrile, with resolving leukocytosis UA showed large leukocytes, many bacteria, >50 WBC UC with multiple species BC growing E. 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   Weight as of this encounter: 108 kg.    Procedures:  None  Consultations:  None  Discharge Exam: BP (!) 145/99 (BP Location: Left Arm)   Pulse 66   Temp 98.1 F (36.7 C)   Resp 17   Ht 5\' 3"  (1.6 m)   Wt 108 kg   SpO2 97%   BMI 42.16 kg/m   General: NAD Cardiovascular: S1, S2 present Respiratory: CTA B Abdomen: Soft, nontender, nondistended, bowel sounds present    Discharge Instructions You were cared for by a hospitalist during your hospital stay. If  you have any questions about your discharge medications or the care you received while you were in the hospital after you are discharged, you can call the unit and asked to speak with the hospitalist on call if the  hospitalist that took care of you is not available. Once you are discharged, your primary care physician will handle any further medical issues. Please note that NO REFILLS for any discharge medications will be authorized once you are discharged, as it is imperative that you return to your primary care physician (or establish a relationship with a primary care physician if you do not have one) for your aftercare needs so that they can reassess your need for medications and monitor your lab values.  Discharge Instructions    Diet - low sodium heart healthy   Complete by: As directed    Increase activity slowly   Complete by: As directed      Allergies as of 01/30/2021      Reactions   Chocolate Hives   Sulfa Antibiotics Hives   Tramadol Hives      Medication List    STOP taking these medications   meloxicam 15 MG tablet Commonly known as: MOBIC   predniSONE 5 MG (21) Tbpk tablet Commonly known as: STERAPRED UNI-PAK 21 TAB     TAKE these medications   cephALEXin 500 MG capsule Commonly known as: KEFLEX Take 2 capsules (1,000 mg total) by mouth every 8 (eight) hours for 6 days.   DULoxetine 60 MG capsule Commonly known as: CYMBALTA Take 60 mg by mouth daily.   fluconazole 150 MG tablet Commonly known as: DIFLUCAN Take 150 mg by mouth daily. 7 day supply   HYDROcodone-acetaminophen 7.5-325 MG tablet Commonly known as: NORCO Take 1 tablet by mouth 3 (three) times daily as needed for pain.   multivitamin with minerals Tabs tablet Take 1 tablet by mouth daily.   polyethylene glycol 17 g packet Commonly known as: MIRALAX / GLYCOLAX Take 17 g by mouth daily as needed for mild constipation.   QUEtiapine 100 MG tablet Commonly known as: SEROQUEL Take 150 mg by mouth at bedtime.      Allergies  Allergen Reactions  . Chocolate Hives  . Sulfa Antibiotics Hives  . Tramadol Hives    Follow-up Information    Karie Mainland, FNP. Schedule an appointment as soon as  possible for a visit in 1 week(s).   Specialty: Family Medicine Contact information: [address] [place] [address] [phone]                The results of significant diagnostics from this hospitalization (including imaging, microbiology, ancillary and laboratory) are listed below for reference.    Significant Diagnostic Studies: CT ABDOMEN PELVIS WO CONTRAST  Result Date: 01/25/2021 CLINICAL DATA:  Low back pain, no known injury, initial encounter EXAM: CT ABDOMEN AND PELVIS WITHOUT CONTRAST TECHNIQUE: Multidetector CT imaging of the abdomen and pelvis was performed following the standard protocol without IV contrast. COMPARISON:  None. FINDINGS: Lower chest: Mild left basilar atelectasis is noted. Hepatobiliary: No focal liver abnormality is seen. No gallstones, gallbladder wall thickening, or biliary dilatation. Pancreas: Unremarkable. No pancreatic ductal dilatation or surrounding inflammatory changes. Spleen: Normal in size without focal abnormality. Adrenals/Urinary Tract: Adrenal glands are within normal limits. Kidneys are well visualized bilaterally. No renal calculi are noted. Mild perinephric stranding is seen. The ureters demonstrate some mild surrounding inflammatory change as well. No obstructive changes are seen. The bladder is partially distended. Stomach/Bowel:  Appendix is within normal limits. No obstructive or inflammatory changes of the colon are seen. Small bowel and stomach are within normal limits. Vascular/Lymphatic: Aortic atherosclerosis. No enlarged abdominal or pelvic lymph nodes. Reproductive: Uterus and bilateral adnexa are unremarkable. Other: Minimal free fluid likely physiologic in nature within the pelvic cul-de-sac. Musculoskeletal: No acute or significant osseous findings. IMPRESSION: Mild perinephric stranding and Periureteral stranding without definitive obstructive change. Possibility of underlying pyelonephritis would deserve consideration.  Correlation with laboratory values is recommended. Normal-appearing appendix. Mild left basilar atelectasis. Electronically Signed   By: Inez Catalina M.D.   On: 01/25/2021 21:02   CT L-SPINE NO CHARGE  Result Date: 01/25/2021 CLINICAL DATA:  Initial evaluation for back pain with extension into the lower extremities. EXAM: CT LUMBAR SPINE WITHOUT CONTRAST TECHNIQUE: Multidetector CT imaging of the lumbar spine was performed without intravenous contrast administration. Multiplanar CT image reconstructions were also generated. COMPARISON:  Prior radiograph from 03/04/2019 FINDINGS: Segmentation: Standard. Lowest well-formed disc space labeled the L5-S1 level. Alignment: Physiologic with preservation of the normal lumbar lordosis. No listhesis. Vertebrae: Vertebral body height maintained without acute or chronic fracture. Visualized sacrum and pelvis intact. SI joints symmetric and within normal limits. No discrete or worrisome osseous lesions. Paraspinal and other soft tissues: Paraspinous soft tissues demonstrate no acute finding. Mild aorto bi-iliac atherosclerotic disease. Duplicated IVC noted. Disc levels: L1-2:  Unremarkable. L2-3:  Unremarkable. L3-4: Negative interspace. Minimal facet hypertrophy. No canal or foraminal stenosis. L4-5: Minimal disc bulge with facet hypertrophy. No spinal stenosis. Foramina remain patent. L5-S1: Shallow broad-based central disc protrusion (series 4, image 105). Protruding disc closely approximates both of the descending S1 nerve roots without frank neural impingement or displacement. Mild facet hypertrophy. No significant spinal stenosis. Foramina remain patent. IMPRESSION: 1. No acute abnormality within the lumbar spine. 2. Shallow broad-based central disc protrusion at L5-S1, closely approximating both of the descending S1 nerve roots without frank neural impingement or displacement. 3. No other significant stenosis or evidence for neural impingement within the lumbar spine.  4. Aortic Atherosclerosis (ICD10-I70.0). Electronically Signed   By: Jeannine Boga M.D.   On: 01/25/2021 21:22   DG Chest Port 1 View  Result Date: 01/25/2021 CLINICAL DATA:  Questionable sepsis - evaluate for abnormality Patient reports back pain radiating down both legs. EXAM: PORTABLE CHEST 1 VIEW COMPARISON:  Radiograph 03/18/2018 FINDINGS: Overlying monitoring devices partially obscure evaluation. Lung volumes are low. Mild bibasilar atelectasis. No confluent consolidation. Normal heart size and mediastinal contours. No pleural fluid or pneumothorax. No pulmonary edema. No acute osseous abnormalities are seen. IMPRESSION: Low lung volumes with mild bibasilar atelectasis. Electronically Signed   By: Keith Rake M.D.   On: 01/25/2021 20:18    Microbiology: Recent Results (from the past 240 hour(s))  Urine culture     Status: Abnormal   Collection Time: 01/25/21  7:09 PM   Specimen: In/Out Cath Urine  Result Value Ref Range Status   Specimen Description   Final    IN/OUT CATH URINE Performed at [facility], [address] [place], [place] [address]    Special Requests   Final    NONE Performed at [facility], [address] [place], [place] [address]    Culture MULTIPLE SPECIES PRESENT, SUGGEST RECOLLECTION (A)  Final   Report Status 01/27/2021 FINAL  Final  Resp Panel by RT-PCR (Flu A&B, Covid) Nasopharyngeal Swab     Status: None   Collection Time: 01/25/21  7:09 PM  Specimen: Nasopharyngeal Swab; Nasopharyngeal(NP) swabs in vial transport medium  Result Value Ref Range Status   SARS Coronavirus 2 by RT PCR NEGATIVE NEGATIVE Final    Comment: (NOTE) SARS-CoV-2 target nucleic acids are NOT DETECTED.  The SARS-CoV-2 RNA is generally detectable in upper respiratory specimens during the acute phase of infection. The lowest concentration of SARS-CoV-2 viral copies this assay can detect is 138 copies/mL. A negative result does not preclude  SARS-Cov-2 infection and should not be used as the sole basis for treatment or other patient management decisions. A negative result may occur with  improper specimen collection/handling, submission of specimen other than nasopharyngeal swab, presence of viral mutation(s) within the areas targeted by this assay, and inadequate number of viral copies(<138 copies/mL). A negative result must be combined with clinical observations, patient history, and epidemiological information. The expected result is Negative.  Fact Sheet for Patients:  EntrepreneurPulse.com.au  Fact Sheet for Healthcare Providers:  IncredibleEmployment.be  This test is no t yet approved or cleared by the [place] FDA and  has been authorized for detection and/or diagnosis of SARS-CoV-2 by FDA under an Emergency Use Authorization (EUA). This EUA will remain  in effect (meaning this test can be used) for the duration of the COVID-19 declaration under Section 564(b)(1) of the Act, 21 U.S.C.section 360bbb-3(b)(1), unless the authorization is terminated  or revoked sooner.       Influenza A by PCR NEGATIVE NEGATIVE Final   Influenza B by PCR NEGATIVE NEGATIVE Final    Comment: (NOTE) The Xpert Xpress SARS-CoV-2/FLU/RSV plus assay is intended as an aid in the diagnosis of influenza from Nasopharyngeal swab specimens and should not be used as a sole basis for treatment. Nasal washings and aspirates are unacceptable for Xpert Xpress SARS-CoV-2/FLU/RSV testing.  Fact Sheet for Patients: EntrepreneurPulse.com.au  Fact Sheet for Healthcare Providers: IncredibleEmployment.be  This test is not yet approved or cleared by the [place] FDA and has been authorized for detection and/or diagnosis of SARS-CoV-2 by FDA under an Emergency Use Authorization (EUA). This EUA will remain in effect (meaning this test can be used) for the duration of  the COVID-19 declaration under Section 564(b)(1) of the Act, 21 U.S.C. section 360bbb-3(b)(1), unless the authorization is terminated or revoked.  Performed at [facility], [address] [place], [place] [address]   Blood culture (routine single)     Status: Abnormal   Collection Time: 01/25/21  7:10 PM   Specimen: BLOOD  Result Value Ref Range Status   Specimen Description   Final    BLOOD RIGHT ANTECUBITAL Performed at [facility], [address] [place], [place] [address]    Special Requests   Final    BOTTLES DRAWN AEROBIC AND ANAEROBIC Blood Culture adequate volume Performed at [facility], [address] [place], [place] [address]    Culture  Setup Time   Final    GRAM NEGATIVE RODS AEROBIC BOTTLE ONLY CRITICAL RESULT CALLED TO, READ BACK BY AND VERIFIED WITHJonetta Speak PHARMD 1410 01/26/21 A BROWNING    Culture (A)  Final    ESCHERICHIA COLI SUSCEPTIBILITIES PERFORMED ON PREVIOUS CULTURE WITHIN THE LAST 5 DAYS. Performed at [facility], [place] [address], [place], [place] [address]    Report Status 01/28/2021 FINAL  Final  Culture, blood (single)     Status: Abnormal   Collection Time: 01/25/21  9:01 PM   Specimen: BLOOD  Result Value Ref Range Status  Specimen Description   Final    BLOOD BLOOD RIGHT WRIST Performed at [facility], [address] [place], [place] [address]    Special Requests   Final    BOTTLES DRAWN AEROBIC AND ANAEROBIC Blood Culture adequate volume Performed at [facility], [address] [place], [place] [address]    Culture  Setup Time   Final    GRAM NEGATIVE RODS IN BOTH AEROBIC AND ANAEROBIC BOTTLES CRITICAL RESULT CALLED TO, READ BACK BY AND VERIFIED WITHJonetta Speak [facility] [id number] 01/26/21 A BROWNING Performed at [facility], [place] [address], [place], [place] [address]    Culture ESCHERICHIA COLI (A)  Final   Report Status 01/28/2021 FINAL  Final   Organism ID,  Bacteria ESCHERICHIA COLI  Final      Susceptibility   Escherichia coli - MIC*    AMPICILLIN >=32 RESISTANT Resistant     CEFAZOLIN <=4 SENSITIVE Sensitive     CEFEPIME <=0.12 SENSITIVE Sensitive     CEFTAZIDIME <=1 SENSITIVE Sensitive     CEFTRIAXONE <=0.25 SENSITIVE Sensitive     CIPROFLOXACIN <=0.25 SENSITIVE Sensitive     GENTAMICIN <=1 SENSITIVE Sensitive     IMIPENEM <=0.25 SENSITIVE Sensitive     TRIMETH/SULFA <=20 SENSITIVE Sensitive     AMPICILLIN/SULBACTAM >=32 RESISTANT Resistant     PIP/TAZO <=4 SENSITIVE Sensitive     * ESCHERICHIA COLI  Blood Culture ID Panel (Reflexed)     Status: Abnormal   Collection Time: 01/25/21  9:01 PM  Result Value Ref Range Status   Enterococcus faecalis NOT DETECTED NOT DETECTED Final   Enterococcus Faecium NOT DETECTED NOT DETECTED Final   Listeria monocytogenes NOT DETECTED NOT DETECTED Final   Staphylococcus species NOT DETECTED NOT DETECTED Final   Staphylococcus aureus (BCID) NOT DETECTED NOT DETECTED Final   Staphylococcus epidermidis NOT DETECTED NOT DETECTED Final   Staphylococcus lugdunensis NOT DETECTED NOT DETECTED Final   Streptococcus species NOT DETECTED NOT DETECTED Final   Streptococcus agalactiae NOT DETECTED NOT DETECTED Final   Streptococcus pneumoniae NOT DETECTED NOT DETECTED Final   Streptococcus pyogenes NOT DETECTED NOT DETECTED Final   A.calcoaceticus-baumannii NOT DETECTED NOT DETECTED Final   Bacteroides fragilis NOT DETECTED NOT DETECTED Final   Enterobacterales DETECTED (A) NOT DETECTED Final    Comment: Enterobacterales represent a large order of gram negative bacteria, not a single organism. CRITICAL RESULT CALLED TO, READ BACK BY AND VERIFIED WITH: Jonetta Speak [id number] 01/26/21 A BROWNING    Enterobacter cloacae complex NOT DETECTED NOT DETECTED Final   Escherichia coli DETECTED (A) NOT DETECTED Final    Comment: CRITICAL RESULT CALLED TO, READ BACK BY AND VERIFIED WITHJonetta Speak PHARMD 1410 01/26/21 A  BROWNING    Klebsiella aerogenes NOT DETECTED NOT DETECTED Final   Klebsiella oxytoca NOT DETECTED NOT DETECTED Final   Klebsiella pneumoniae NOT DETECTED NOT DETECTED Final   Proteus species NOT DETECTED NOT DETECTED Final   Salmonella species NOT DETECTED NOT DETECTED Final   Serratia marcescens NOT DETECTED NOT DETECTED Final   Haemophilus influenzae NOT DETECTED NOT DETECTED Final   Neisseria meningitidis NOT DETECTED NOT DETECTED Final   Pseudomonas aeruginosa NOT DETECTED NOT DETECTED Final   Stenotrophomonas maltophilia NOT DETECTED NOT DETECTED Final   Candida albicans NOT DETECTED NOT DETECTED Final   Candida auris NOT DETECTED NOT DETECTED Final   Candida glabrata NOT DETECTED NOT DETECTED Final   Candida krusei NOT DETECTED NOT DETECTED Final  Candida parapsilosis NOT DETECTED NOT DETECTED Final   Candida tropicalis NOT DETECTED NOT DETECTED Final   Cryptococcus neoformans/gattii NOT DETECTED NOT DETECTED Final   CTX-M ESBL NOT DETECTED NOT DETECTED Final   Carbapenem resistance IMP NOT DETECTED NOT DETECTED Final   Carbapenem resistance KPC NOT DETECTED NOT DETECTED Final   Carbapenem resistance NDM NOT DETECTED NOT DETECTED Final   Carbapenem resist OXA 48 LIKE NOT DETECTED NOT DETECTED Final   Carbapenem resistance VIM NOT DETECTED NOT DETECTED Final    Comment: Performed at [facility], [place] [address] [place], [place] [address]  Culture, blood (routine x 2)     Status: None (Preliminary result)   Collection Time: 01/29/21  7:38 AM   Specimen: BLOOD  Result Value Ref Range Status   Specimen Description BLOOD LEFT ANTECUBITAL  Final   Special Requests   Final    BOTTLES DRAWN AEROBIC AND ANAEROBIC Blood Culture adequate volume   Culture   Final    NO GROWTH < 24 HOURS Performed at [facility], [place] [address], [place], [place] [address]    Report Status PENDING  Incomplete  Culture, blood (routine x 2)     Status: None (Preliminary result)    Collection Time: 01/29/21  7:39 AM   Specimen: BLOOD  Result Value Ref Range Status   Specimen Description BLOOD LEFT ANTECUBITAL  Final   Special Requests   Final    BOTTLES DRAWN AEROBIC AND ANAEROBIC Blood Culture adequate volume   Culture   Final    NO GROWTH < 24 HOURS Performed at [facility], [place] [address] [place],  [address]    Report Status PENDING  Incomplete     Labs: Basic Metabolic Panel: Recent Labs  Lab 01/26/21 0630 01/27/21 0332 01/28/21 0427 01/29/21 0500 01/30/21 0349  NA 139 138 137 138 139  K 3.6 3.6 3.3* 3.7 4.1  CL 108 108 108 107 106  CO2 24 22 23 24 24   GLUCOSE 103* 80 95 93 90  BUN 8 7 8 7  <5*  CREATININE 1.40* 1.39* 1.35* 1.29* 1.33*  CALCIUM 9.0 9.0 9.2 9.3 9.6  MG 2.0  --   --   --   --    Liver Function Tests: Recent Labs  Lab 01/26/21 0630 01/27/21 0332 01/28/21 0427 01/29/21 0500 01/30/21 0349  AST 19 17 17 19 26   ALT 21 19 18 22 30   ALKPHOS 125 132* 127* 118 109  BILITOT 1.3* 1.2 0.7 0.7 0.5  PROT 6.1* 6.2* 6.0* 6.2* 6.5  ALBUMIN 2.5* 2.2* 2.0* 2.0* 2.2*   No results for input(s): LIPASE, AMYLASE in the last 168 hours. No results for input(s): AMMONIA in the last 168 hours. CBC: Recent Labs  Lab 01/26/21 0630 01/27/21 0332 01/28/21 0427 01/29/21 0500 01/30/21 0349  WBC 20.7* 19.0* 14.6* 12.5* 10.7*  NEUTROABS 16.8* 14.6* 10.1* 7.0 5.9  HGB 12.7 12.8 12.3 12.2 12.1  HCT 38.3 39.0 36.3 36.2 35.9*  MCV 88.5 88.0 85.6 85.6 86.9  PLT 176 189 244 270 310   Cardiac Enzymes: No results for input(s): CKTOTAL, CKMB, CKMBINDEX, TROPONINI in the last 168 hours. BNP: BNP (last 3 results) No results for input(s): BNP in the last 8760 hours.  ProBNP (last 3 results) No results for input(s): PROBNP in the last 8760 hours.  CBG: No results for input(s): GLUCAP in the last 168 hours.     Signed:  Alma Friendly, MD [facility] 01/30/2021,  11:02 AM

## 2021-01-30 NOTE — Plan of Care (Signed)
?  Problem: Clinical Measurements: ?Goal: Will remain free from infection ?Outcome: Progressing ?  ?

## 2021-01-30 NOTE — Plan of Care (Signed)
  Problem: Clinical Measurements: Goal: Will remain free from infection 01/30/2021 1008 by Dolores Hoose, RN Outcome: Adequate for Discharge 01/30/2021 0747 by Dolores Hoose, RN Outcome: Progressing

## 2021-02-04 LAB — CULTURE, BLOOD (ROUTINE X 2)
Culture: NO GROWTH
Culture: NO GROWTH
Special Requests: ADEQUATE
Special Requests: ADEQUATE

## 2022-08-20 IMAGING — CT CT ABD-PELV W/O CM
2 of 4 series · 16 of 46 positions shown, 18 images · non-contrast
Comparison: None.

CLINICAL DATA: Low back pain, no known injury, initial encounter

EXAM:
CT ABDOMEN AND PELVIS WITHOUT CONTRAST
TECHNIQUE: Multidetector CT imaging of the abdomen and pelvis was performed
following the standard protocol without IV contrast.

[Series 2: axial st · axial · 0.93mm/px · z∈[-482,-28]mm · 13 of 99 slices shown, 15 images]
[im 4/99  soft-tissue]
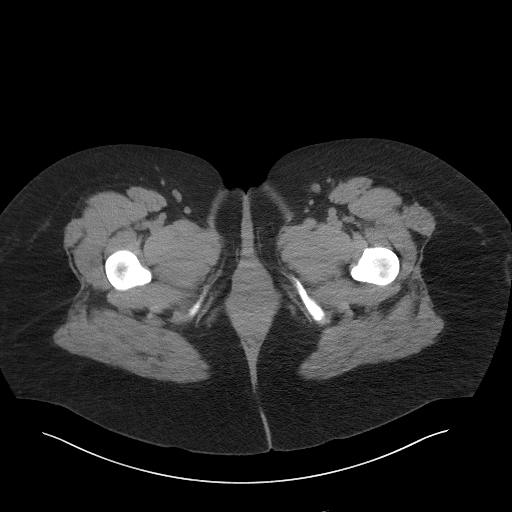
[im 4/99  bone]
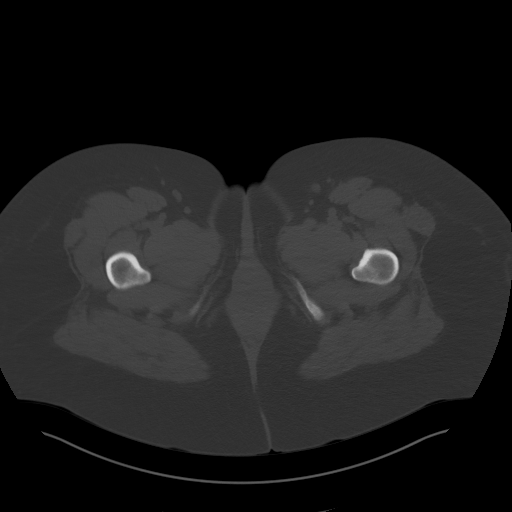
[im 12/99  soft-tissue]
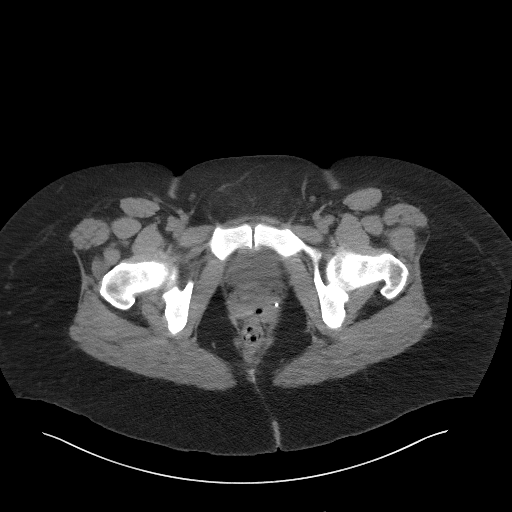
[im 20/99  soft-tissue]
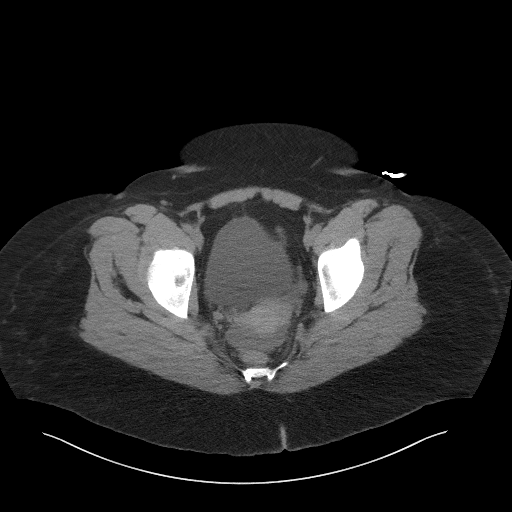
[im 28/99  soft-tissue]
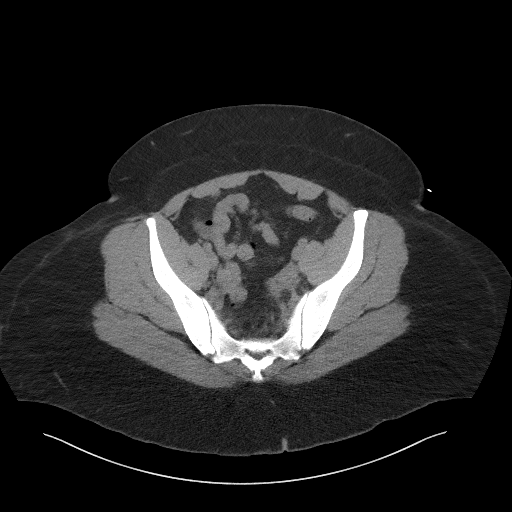
[im 36/99  soft-tissue]
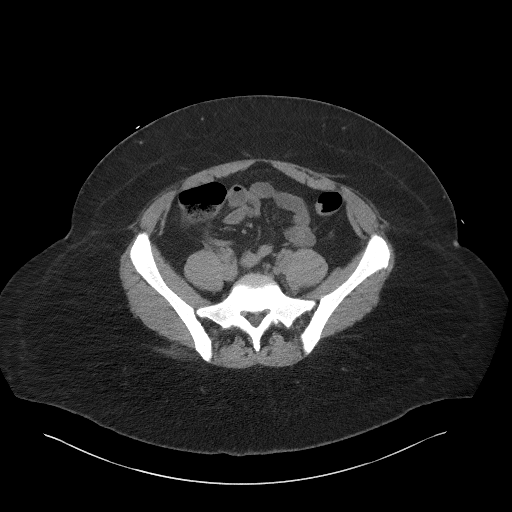
[im 44/99  soft-tissue]
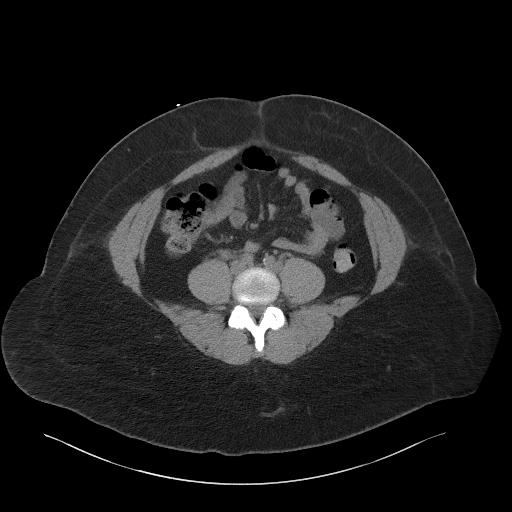
[im 51/99  soft-tissue]
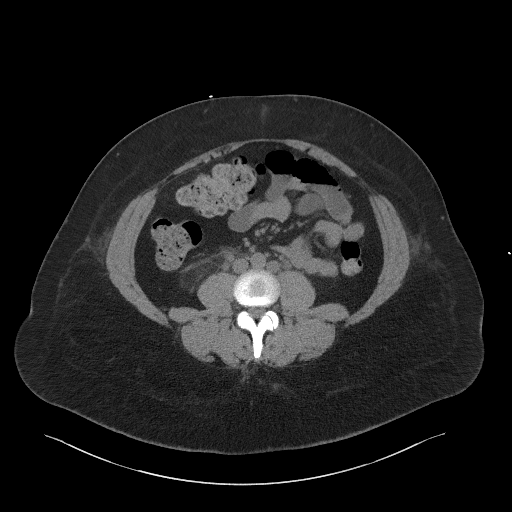
[im 55/99  soft-tissue]
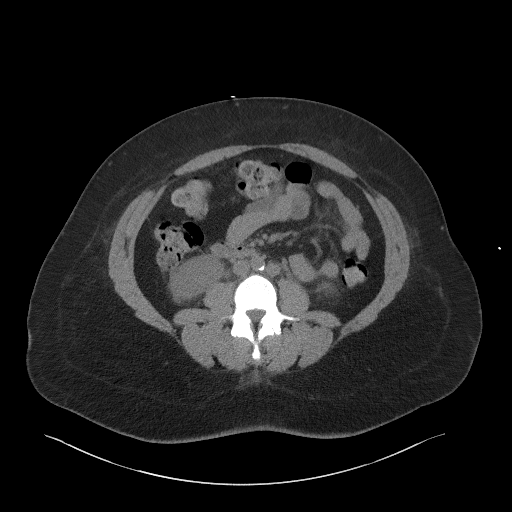
[im 63/99  soft-tissue]
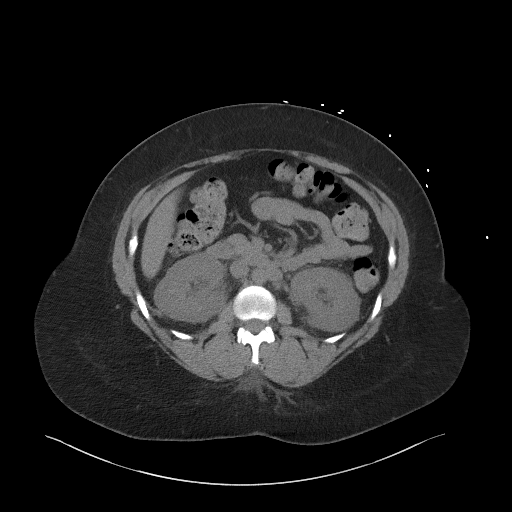
[im 63/99  bone]
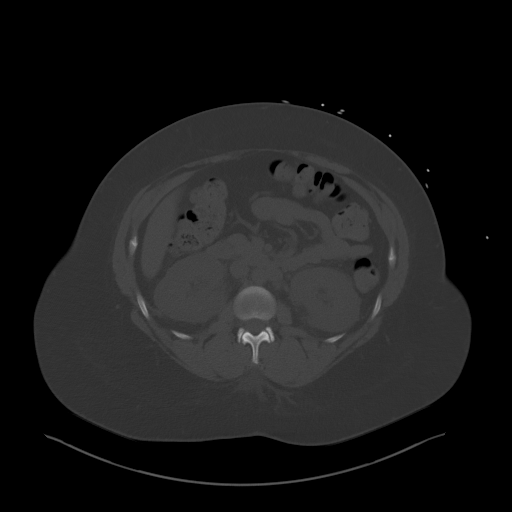
[im 71/99  soft-tissue]
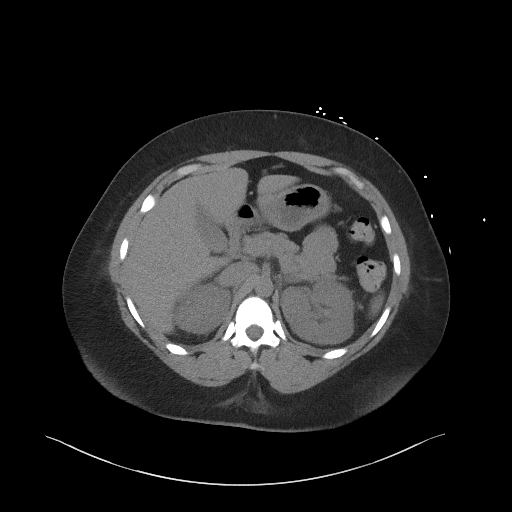
[im 79/99  soft-tissue]
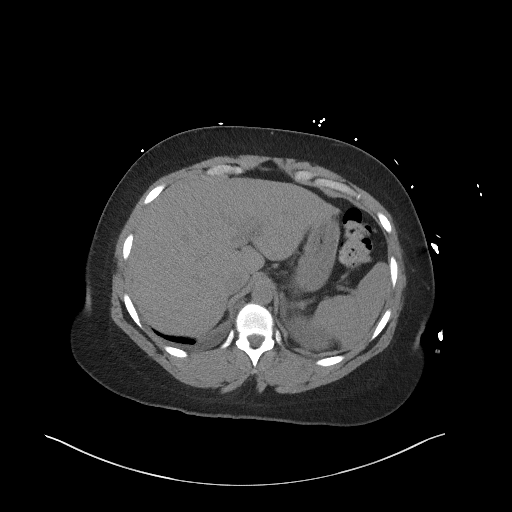
[im 87/99  soft-tissue]
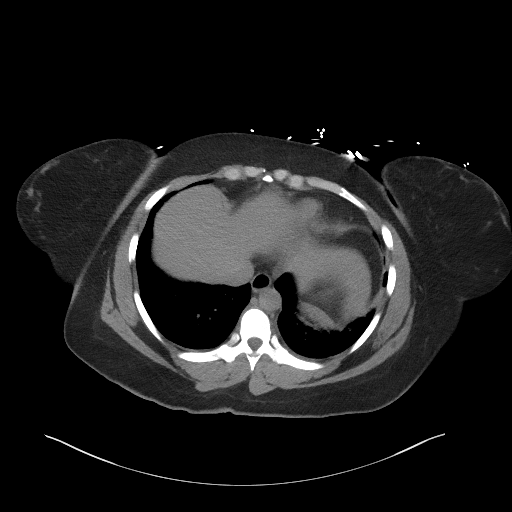
[im 95/99  soft-tissue]
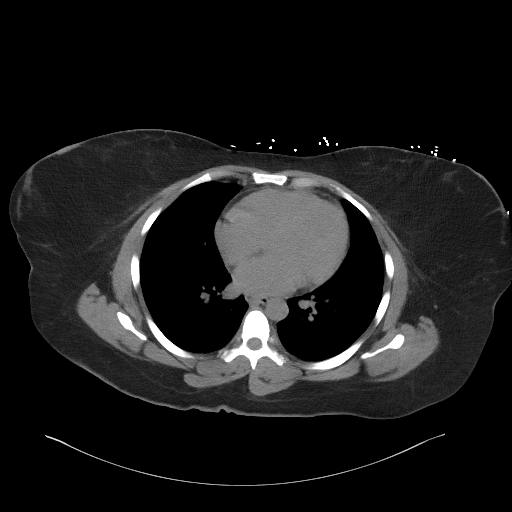

[Series 5: coronal st · coronal · 0.87mm/px · 3 of 116 slices shown]
[im 39/116  soft-tissue]
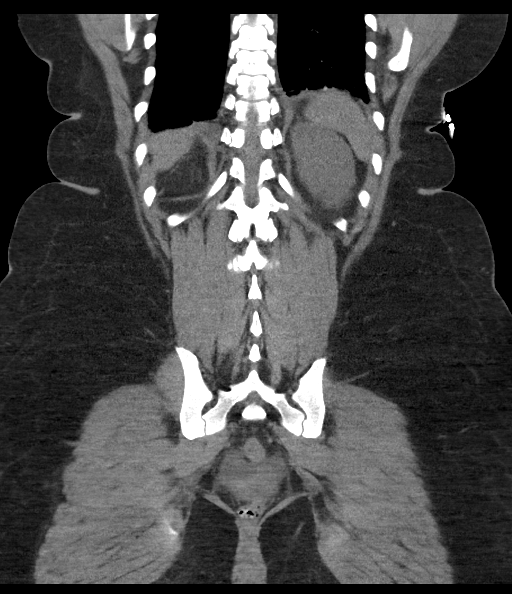
[im 52/116  soft-tissue]
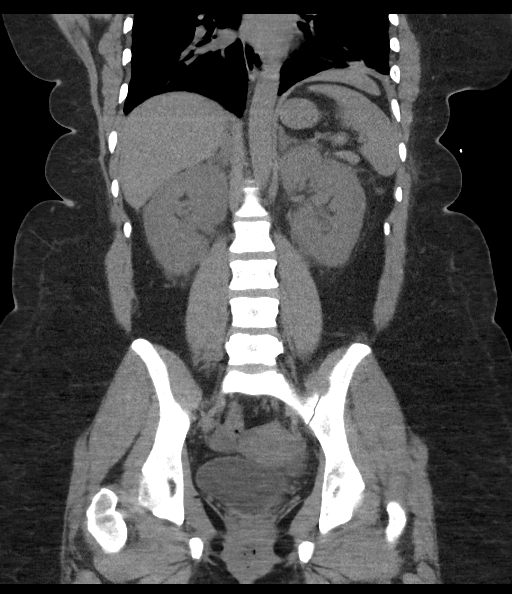
[im 64/116  soft-tissue]
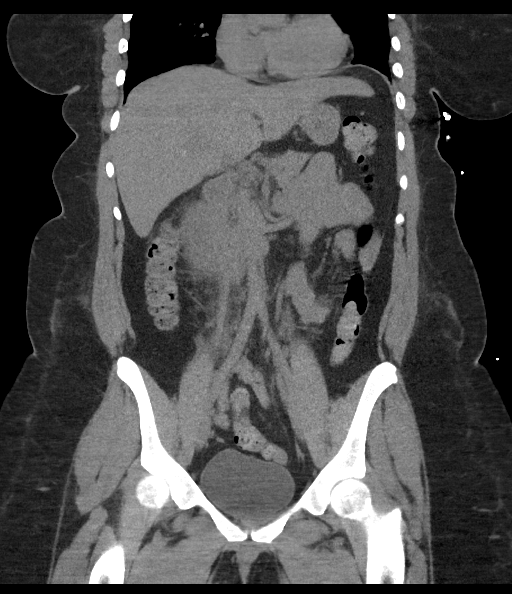

[16 of 46 positions shown; findings below may reference images not displayed]

FINDINGS: Lower chest: Mild left basilar atelectasis is noted.

Hepatobiliary: No focal liver abnormality is seen. No gallstones,
gallbladder wall thickening, or biliary dilatation.

Pancreas: Unremarkable. No pancreatic ductal dilatation or
surrounding inflammatory changes.

Spleen: Normal in size without focal abnormality.

Adrenals/Urinary Tract: Adrenal glands are within normal limits.
Kidneys are well visualized bilaterally. No renal calculi are noted.
Mild perinephric stranding is seen. The ureters demonstrate some
mild surrounding inflammatory change as well. No obstructive changes
are seen. The bladder is partially distended.

Stomach/Bowel: Appendix is within normal limits. No obstructive or
inflammatory changes of the colon are seen. Small bowel and stomach
are within normal limits.

Vascular/Lymphatic: Aortic atherosclerosis. No enlarged abdominal or
pelvic lymph nodes.

Reproductive: Uterus and bilateral adnexa are unremarkable.

Other: Minimal free fluid likely physiologic in nature within the
pelvic cul-de-sac.

Musculoskeletal: No acute or significant osseous findings.
IMPRESSION: Mild perinephric stranding and Periureteral stranding without
definitive obstructive change. Possibility of underlying
pyelonephritis would deserve consideration. Correlation with
laboratory values is recommended.

Normal-appearing appendix.

Mild left basilar atelectasis.

## 2022-08-20 IMAGING — CT CT L SPINE W/O CM
3 of 4 series · 10 of 33 positions shown, 11 images · non-contrast
Comparison: Prior radiograph from 03/04/2019

CLINICAL DATA: Initial evaluation for back pain with extension into
the lower extremities.

EXAM:
CT LUMBAR SPINE WITHOUT CONTRAST
TECHNIQUE: Multidetector CT imaging of the lumbar spine was performed without
intravenous contrast administration. Multiplanar CT image
reconstructions were also generated.

[Series 7: sagittal soft · sagittal · 0.27mm/px · 5 of 55 slices shown]
[im 19/55  bone]
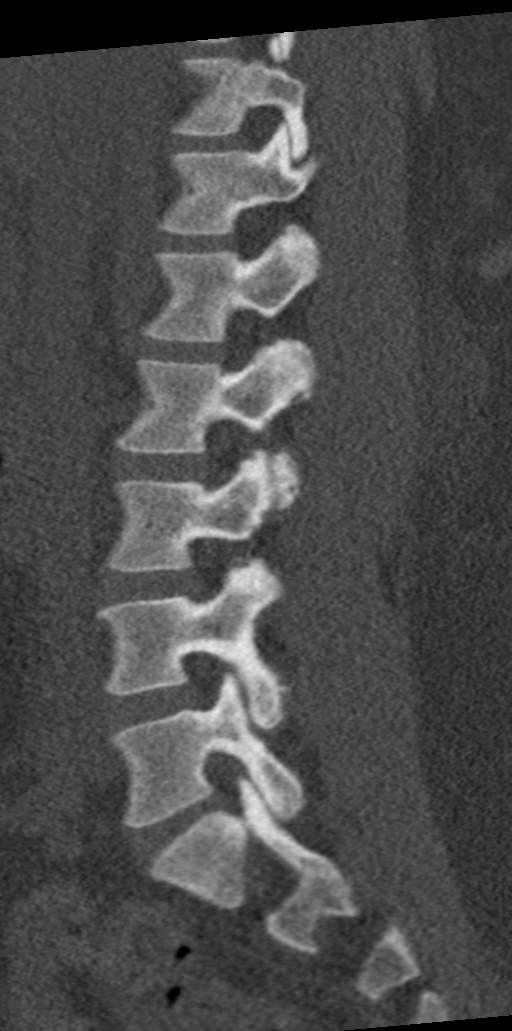
[im 23/55  bone]
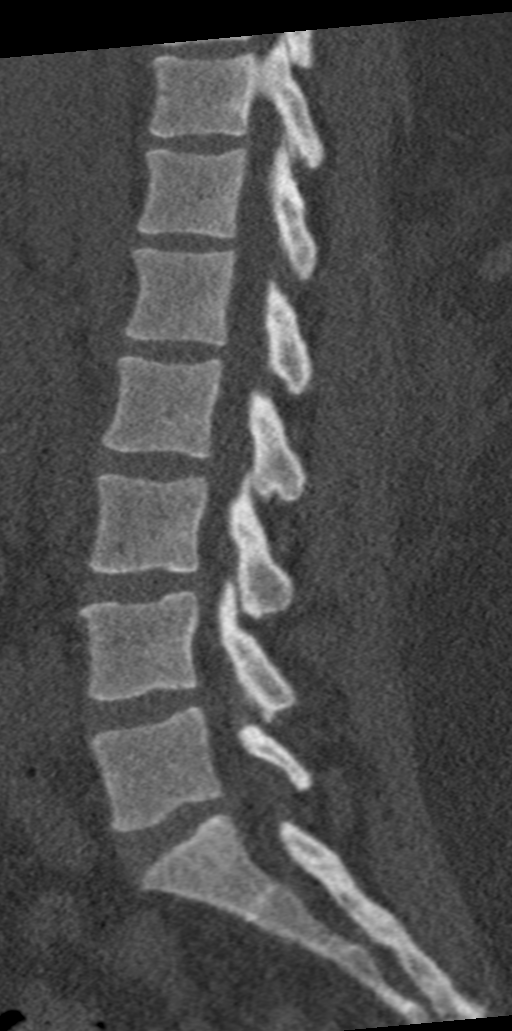
[im 28/55  bone]
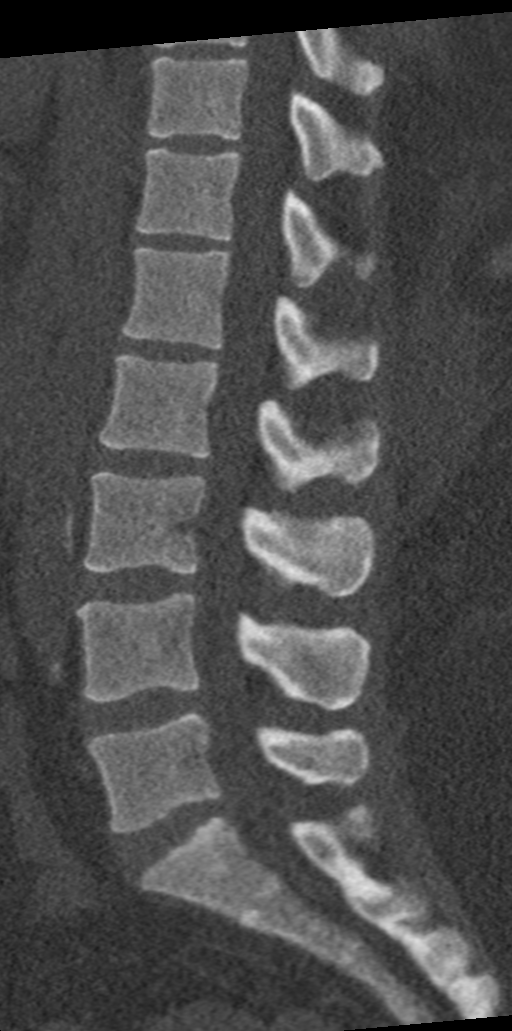
[im 32/55  bone]
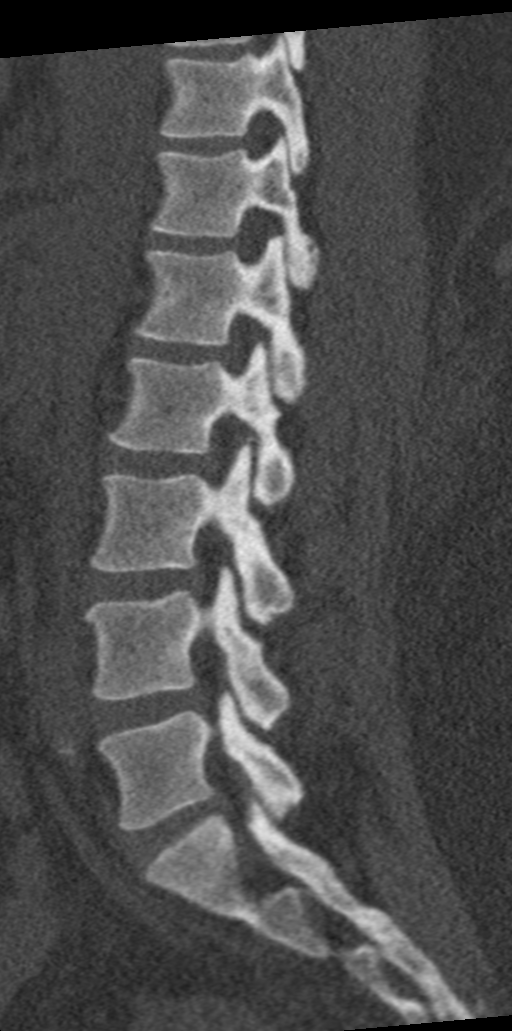
[im 37/55  bone]
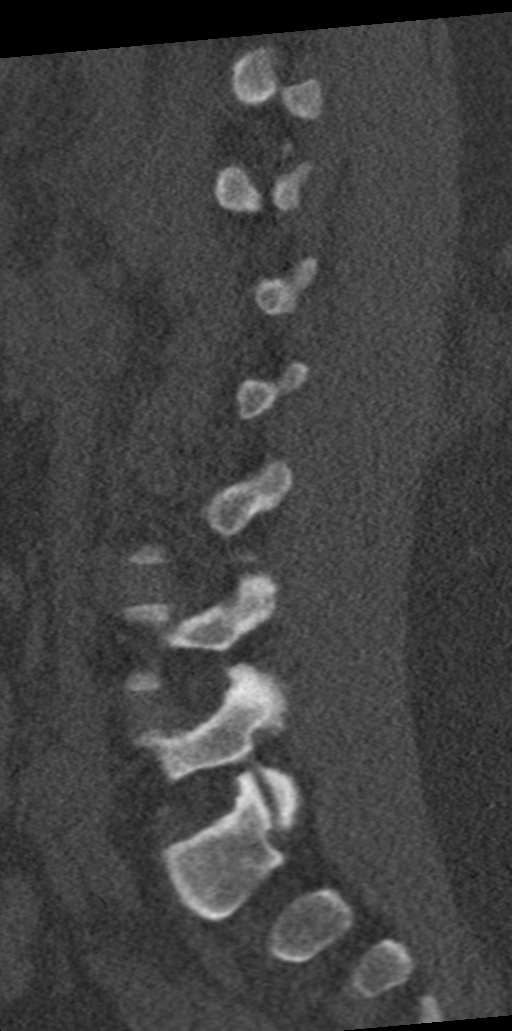

[Series 8: coronal bone · coronal · 0.29mm/px · 3 of 67 slices shown]
[im 14/67  bone]
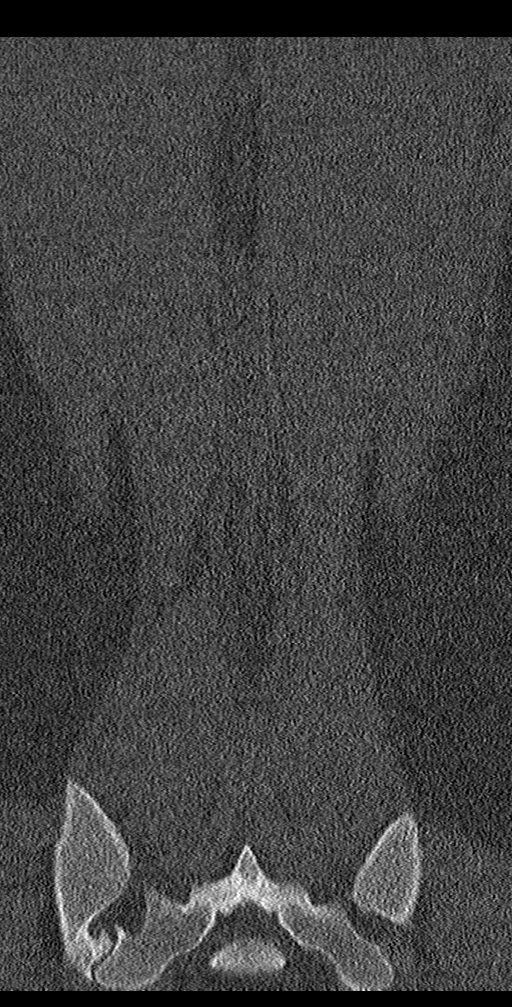
[im 27/67  bone]
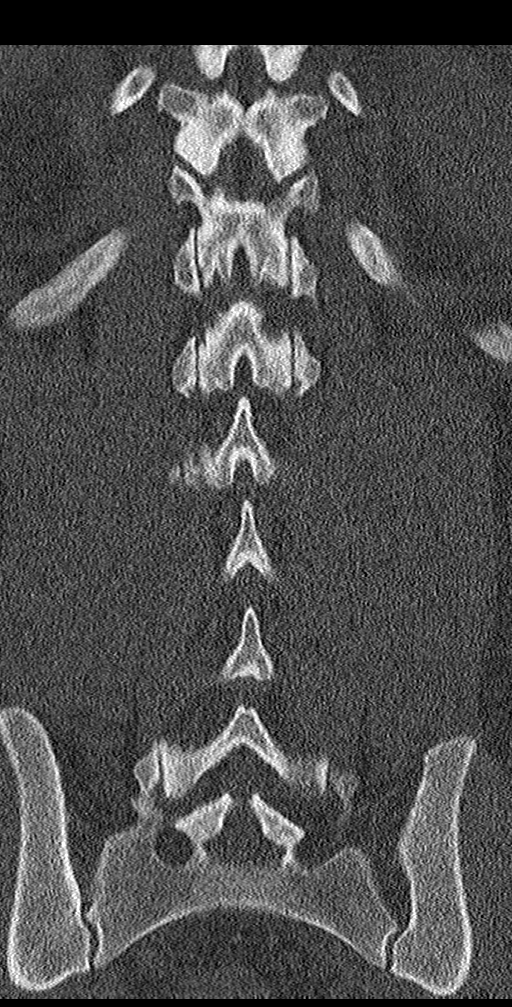
[im 40/67  bone]
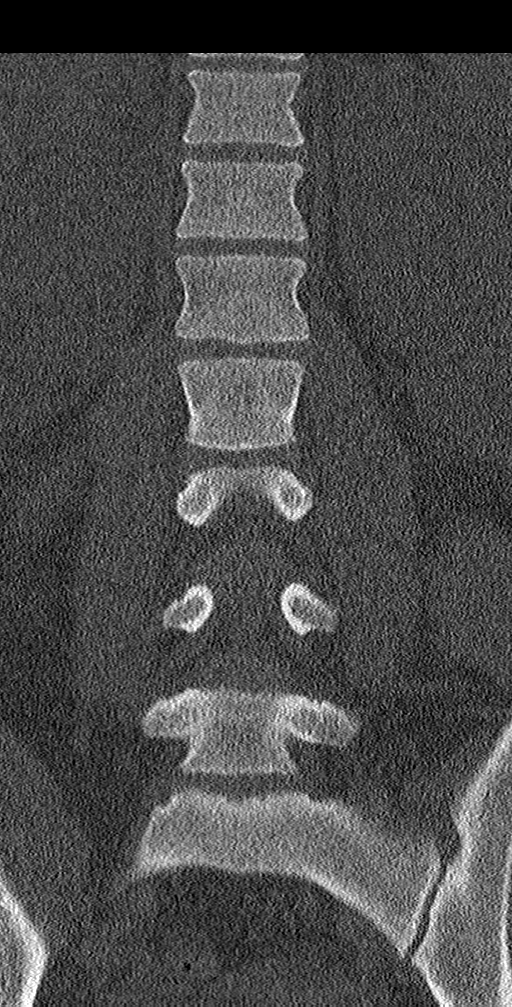

[Series 9: orthogonal axials bone · axial · 0.23mm/px · z∈[-196,-122]mm · 2 of 78 slices shown, 3 images]
[im 20/78  soft-tissue]
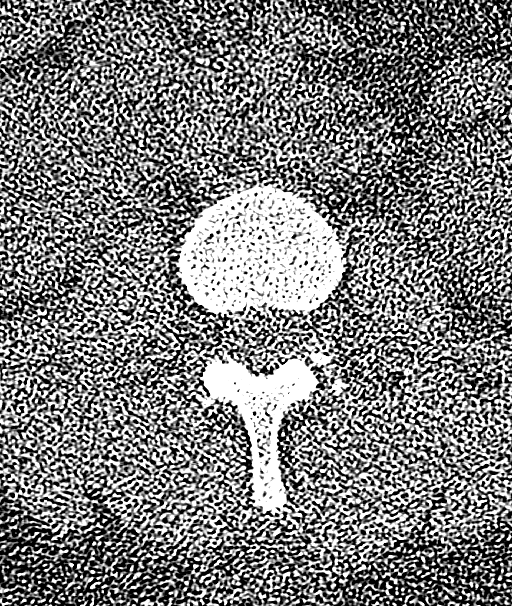
[im 20/78  bone]
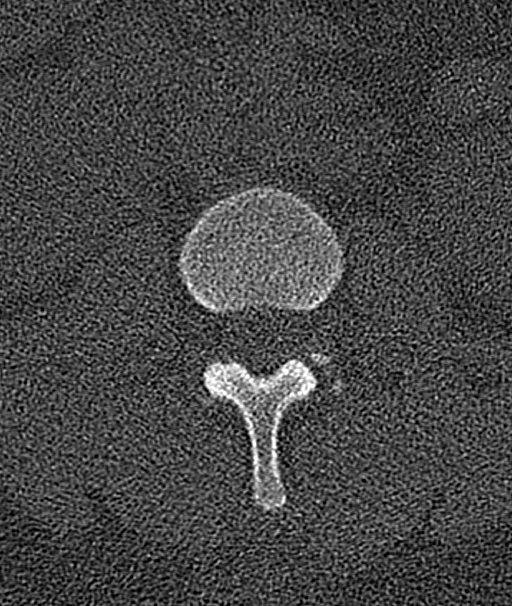
[im 58/78  bone]
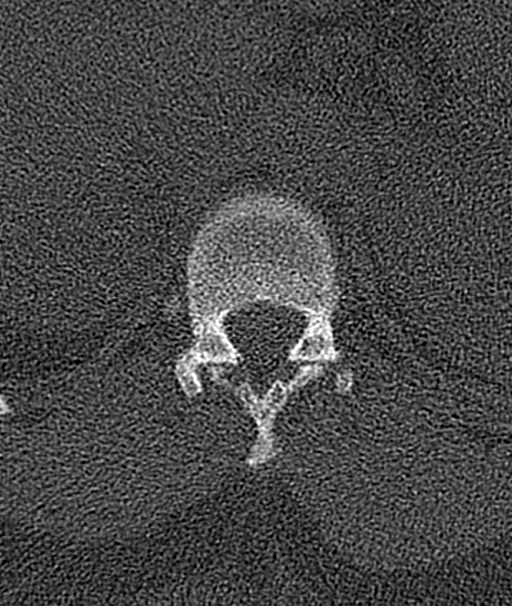

[10 of 33 positions shown; findings below may reference images not displayed]

FINDINGS: Segmentation: Standard. Lowest well-formed disc space labeled the
L5-S1 level.

Alignment: Physiologic with preservation of the normal lumbar
lordosis. No listhesis.

Vertebrae: Vertebral body height maintained without acute or chronic
fracture. Visualized sacrum and pelvis intact. SI joints symmetric
and within normal limits. No discrete or worrisome osseous lesions.

Paraspinal and other soft tissues: Paraspinous soft tissues
demonstrate no acute finding. Mild aorto bi-iliac atherosclerotic
disease. Duplicated IVC noted.

Disc levels:

L1-2:  Unremarkable.

L2-3:  Unremarkable.

L3-4: Negative interspace. Minimal facet hypertrophy. No canal or
foraminal stenosis.

L4-5: Minimal disc bulge with facet hypertrophy. No spinal stenosis.
Foramina remain patent.

L5-S1: Shallow broad-based central disc protrusion (series 4, image
105). Protruding disc closely approximates both of the descending S1
nerve roots without frank neural impingement or displacement. Mild
facet hypertrophy. No significant spinal stenosis. Foramina remain
patent.
IMPRESSION: 1. No acute abnormality within the lumbar spine.
2. Shallow broad-based central disc protrusion at L5-S1, closely
approximating both of the descending S1 nerve roots without frank
neural impingement or displacement.
3. No other significant stenosis or evidence for neural impingement
within the lumbar spine.
4. Aortic Atherosclerosis ([id number]).
